# Patient Record
Sex: Male | Born: 1975 | Race: Black or African American | Hispanic: No | Marital: Single | Smoking: Current every day smoker
Health system: Southern US, Community
[De-identification: ages and names within clinical notes are randomized; demographics above are authoritative.]

## PROBLEM LIST (undated history)

## (undated) DIAGNOSIS — R519 Headache, unspecified: Secondary | ICD-10-CM

## (undated) DIAGNOSIS — K642 Third degree hemorrhoids: Secondary | ICD-10-CM

## (undated) DIAGNOSIS — R079 Chest pain, unspecified: Secondary | ICD-10-CM

## (undated) DIAGNOSIS — Z9289 Personal history of other medical treatment: Secondary | ICD-10-CM

## (undated) DIAGNOSIS — Z86010 Personal history of colonic polyps: Secondary | ICD-10-CM

## (undated) DIAGNOSIS — D509 Iron deficiency anemia, unspecified: Secondary | ICD-10-CM

## (undated) DIAGNOSIS — R51 Headache: Secondary | ICD-10-CM

## (undated) DIAGNOSIS — Z8719 Personal history of other diseases of the digestive system: Secondary | ICD-10-CM

## (undated) DIAGNOSIS — R011 Cardiac murmur, unspecified: Secondary | ICD-10-CM

## (undated) DIAGNOSIS — K644 Residual hemorrhoidal skin tags: Secondary | ICD-10-CM

## (undated) HISTORY — PX: MOUTH SURGERY: SHX715

---

## 2007-07-26 ENCOUNTER — Emergency Department (HOSPITAL_COMMUNITY): Admission: EM | Admit: 2007-07-26 | Discharge: 2007-07-26 | Payer: Self-pay | Admitting: Emergency Medicine

## 2016-12-22 DIAGNOSIS — R079 Chest pain, unspecified: Secondary | ICD-10-CM

## 2016-12-22 DIAGNOSIS — Z9289 Personal history of other medical treatment: Secondary | ICD-10-CM

## 2016-12-22 HISTORY — DX: Chest pain, unspecified: R07.9

## 2016-12-22 HISTORY — DX: Personal history of other medical treatment: Z92.89

## 2017-01-13 ENCOUNTER — Inpatient Hospital Stay (HOSPITAL_BASED_OUTPATIENT_CLINIC_OR_DEPARTMENT_OTHER)
Admission: EM | Admit: 2017-01-13 | Discharge: 2017-01-17 | DRG: 312 | Disposition: A | Discharge status: Home / Self Care | Attending: Internal Medicine | Admitting: Internal Medicine

## 2017-01-13 ENCOUNTER — Emergency Department (HOSPITAL_BASED_OUTPATIENT_CLINIC_OR_DEPARTMENT_OTHER)

## 2017-01-13 ENCOUNTER — Encounter (HOSPITAL_BASED_OUTPATIENT_CLINIC_OR_DEPARTMENT_OTHER): Payer: Self-pay | Admitting: Emergency Medicine

## 2017-01-13 ENCOUNTER — Other Ambulatory Visit: Payer: Self-pay

## 2017-01-13 DIAGNOSIS — D500 Iron deficiency anemia secondary to blood loss (chronic): Secondary | ICD-10-CM | POA: Diagnosis present

## 2017-01-13 DIAGNOSIS — K642 Third degree hemorrhoids: Secondary | ICD-10-CM | POA: Diagnosis present

## 2017-01-13 DIAGNOSIS — E860 Dehydration: Secondary | ICD-10-CM | POA: Diagnosis present

## 2017-01-13 DIAGNOSIS — Z86010 Personal history of colonic polyps: Secondary | ICD-10-CM

## 2017-01-13 DIAGNOSIS — I951 Orthostatic hypotension: Principal | ICD-10-CM | POA: Diagnosis present

## 2017-01-13 DIAGNOSIS — D649 Anemia, unspecified: Secondary | ICD-10-CM | POA: Diagnosis not present

## 2017-01-13 DIAGNOSIS — K644 Residual hemorrhoidal skin tags: Secondary | ICD-10-CM | POA: Diagnosis present

## 2017-01-13 DIAGNOSIS — I509 Heart failure, unspecified: Secondary | ICD-10-CM | POA: Diagnosis not present

## 2017-01-13 DIAGNOSIS — K921 Melena: Secondary | ICD-10-CM | POA: Diagnosis present

## 2017-01-13 DIAGNOSIS — F17210 Nicotine dependence, cigarettes, uncomplicated: Secondary | ICD-10-CM | POA: Diagnosis present

## 2017-01-13 DIAGNOSIS — K640 First degree hemorrhoids: Secondary | ICD-10-CM | POA: Diagnosis not present

## 2017-01-13 DIAGNOSIS — K625 Hemorrhage of anus and rectum: Secondary | ICD-10-CM | POA: Diagnosis not present

## 2017-01-13 LAB — PROTIME-INR
INR: 1.03
Prothrombin Time: 13.4 seconds (ref 11.4–15.2)

## 2017-01-13 LAB — URINALYSIS, ROUTINE W REFLEX MICROSCOPIC
BILIRUBIN URINE: NEGATIVE
Glucose, UA: NEGATIVE mg/dL
Hgb urine dipstick: NEGATIVE
Ketones, ur: NEGATIVE mg/dL
Leukocytes, UA: NEGATIVE
NITRITE: NEGATIVE
PROTEIN: NEGATIVE mg/dL
SPECIFIC GRAVITY, URINE: 1.015 (ref 1.005–1.030)
pH: 6.5 (ref 5.0–8.0)

## 2017-01-13 LAB — COMPREHENSIVE METABOLIC PANEL
ALK PHOS: 79 U/L (ref 38–126)
ALT: 11 U/L — AB (ref 17–63)
AST: 16 U/L (ref 15–41)
Albumin: 3.7 g/dL (ref 3.5–5.0)
Anion gap: 6 (ref 5–15)
BILIRUBIN TOTAL: 0.2 mg/dL — AB (ref 0.3–1.2)
BUN: 11 mg/dL (ref 6–20)
CALCIUM: 8.6 mg/dL — AB (ref 8.9–10.3)
CO2: 25 mmol/L (ref 22–32)
CREATININE: 0.86 mg/dL (ref 0.61–1.24)
Chloride: 104 mmol/L (ref 101–111)
GFR calc non Af Amer: >60 mL/min (ref >60)
GLUCOSE: 108 mg/dL — AB (ref 65–99)
Potassium: 3.5 mmol/L (ref 3.5–5.1)
SODIUM: 135 mmol/L (ref 135–145)
TOTAL PROTEIN: 6.9 g/dL (ref 6.5–8.1)

## 2017-01-13 LAB — LIPASE, BLOOD: LIPASE: 26 U/L (ref 11–51)

## 2017-01-13 LAB — RETICULOCYTES
RBC.: 2.17 MIL/uL — AB (ref 4.22–5.81)
RETIC COUNT ABSOLUTE: 30.4 10*3/uL (ref 19.0–186.0)
RETIC CT PCT: 1.4 % (ref 0.4–3.1)

## 2017-01-13 LAB — CBC WITH DIFFERENTIAL/PLATELET
BASOS PCT: 0 %
Basophils Absolute: 0 10*3/uL (ref 0.0–0.1)
EOS ABS: 0.2 10*3/uL (ref 0.0–0.7)
Eosinophils Relative: 3 %
HCT: 12.2 % — ABNORMAL LOW (ref 39.0–52.0)
Hemoglobin: 3.2 g/dL — CL (ref 13.0–17.0)
Lymphocytes Relative: 34 %
Lymphs Abs: 1.8 10*3/uL (ref 0.7–4.0)
MCH: 14.2 pg — AB (ref 26.0–34.0)
MCHC: 26.2 g/dL — AB (ref 30.0–36.0)
MCV: 54.2 fL — ABNORMAL LOW (ref 78.0–100.0)
MONO ABS: 0.5 10*3/uL (ref 0.1–1.0)
Monocytes Relative: 10 %
NEUTROS ABS: 2.9 10*3/uL (ref 1.7–7.7)
NEUTROS PCT: 53 %
PLATELETS: 334 10*3/uL (ref 150–400)
RBC: 2.25 MIL/uL — ABNORMAL LOW (ref 4.22–5.81)
RDW: 22.2 % — ABNORMAL HIGH (ref 11.5–15.5)
WBC: 5.4 10*3/uL (ref 4.0–10.5)

## 2017-01-13 LAB — FERRITIN: Ferritin: 1 ng/mL — ABNORMAL LOW (ref 24–336)

## 2017-01-13 LAB — IRON AND TIBC
Iron: 7 ug/dL — ABNORMAL LOW (ref 45–182)
Saturation Ratios: 1 % — ABNORMAL LOW (ref 17.9–39.5)
TIBC: 595 ug/dL — AB (ref 250–450)
UIBC: 588 ug/dL

## 2017-01-13 LAB — PREPARE RBC (CROSSMATCH)

## 2017-01-13 LAB — OCCULT BLOOD X 1 CARD TO LAB, STOOL: FECAL OCCULT BLD: NEGATIVE

## 2017-01-13 LAB — FOLATE: Folate: 8.7 ng/mL (ref >5.9)

## 2017-01-13 LAB — TROPONIN I

## 2017-01-13 LAB — VITAMIN B12: VITAMIN B 12: 276 pg/mL (ref 180–914)

## 2017-01-13 MED ORDER — ACETAMINOPHEN 325 MG PO TABS
650.0000 mg | ORAL_TABLET | Freq: Once | ORAL | Status: AC
  Administered 2017-01-13: 650 mg via ORAL
  Filled 2017-01-13: qty 2

## 2017-01-13 MED ORDER — SODIUM CHLORIDE 0.9 % IV BOLUS (SEPSIS)
1000.0000 mL | Freq: Once | INTRAVENOUS | Status: AC
  Administered 2017-01-13: 1000 mL via INTRAVENOUS

## 2017-01-13 MED ORDER — SODIUM CHLORIDE 0.9 % IV SOLN
10.0000 mL/h | Freq: Once | INTRAVENOUS | Status: AC
  Administered 2017-01-13: 10 mL/h via INTRAVENOUS

## 2017-01-13 NOTE — ED Triage Notes (Signed)
Pt sent from UC c/o headache, dizziness for 1 week.

## 2017-01-13 NOTE — ED Notes (Signed)
Pt in radiology 

## 2017-01-13 NOTE — Progress Notes (Signed)
Spoke to [facility]uen [facility]  41 year old male was seen in the ED for dizziness, found to have profoundly low hemoglobin 3.2.  He had 2 episodes of bright red blood per rectum few weeks ago as per ED physician.  I have asked ED physician to check guaiac stool and anemia panel.  Patient will need 3 -4 PRBC transfusion once [facility]. Stool for occult blood Workup for anemia.

## 2017-01-13 NOTE — ED Notes (Signed)
Pt given urinal to provide urine sample

## 2017-01-13 NOTE — ED Notes (Signed)
Critical lab HGB 3.2 Dr Corlis LeakMackuen notified.

## 2017-01-13 NOTE — ED Provider Notes (Signed)
[facility] EMERGENCY DEPARTMENT Provider Note   CSN: [id number] Arrival date & time: 01/13/17  1715     History   Chief Complaint Chief Complaint  Patient presents with  . Dizziness  . Headache    HPI Charles Franco is a 41 y.o. male.  HPI  Patient is a 41 year old male presenting with dizziness.  Patient reports that when he stands up he gets mildly dizzy.  He reports that going on for the last week.  He reports going to an urgent care, according to them he had a "holosystolic murmur" with a "wide pulse pressure".  They were sent here to be "seen by cardiologist".  We do not have access to cardiologist here.  I also hear no systolic murmur on exam.  His dizziness is described as lightheadedness.  It only happens when he goes from lying down to standing.  He appears mildly dehydrated on exam..  Patient has a low-grade fever.  I am concerned most about a low-grade virus causing him to be dehydrated and causing orthostatic hypotension.  He is got no other symptoms such as chest pain, nausea, vomiting, cough, abdominal pain, neurologic symptoms.  He has no past medical history either.  And no family cardiac history.  History reviewed. No pertinent past medical history.  There are no active problems to display for this patient.   History reviewed. No pertinent surgical history.     Home Medications    Prior to Admission medications   Not on File    Family History No family history on file.  Social History Social History   Tobacco Use  . Smoking status: Current Every Day Smoker  . Smokeless tobacco: Never Used  Substance Use Topics  . Alcohol use: Yes  . Drug use: No     Allergies   Patient has no known allergies.   Review of Systems Review of Systems  Constitutional: Positive for fatigue. Negative for activity change and fever.  HENT: Negative for congestion.   Eyes: Negative for photophobia.  Respiratory: Negative for cough, chest  tightness and shortness of breath.   Cardiovascular: Negative for chest pain and palpitations.  Gastrointestinal: Negative for abdominal pain.  Genitourinary: Negative for dysuria.  Neurological: Positive for light-headedness and headaches. Negative for dizziness, tremors, seizures, syncope, speech difficulty, weakness and numbness.  All other systems reviewed and are negative.    Physical Exam Updated Vital Signs BP (!) 129/53 (BP Location: Right Arm)   Pulse 98   Temp (!) 100.4 F (38 C) (Oral)   Resp 16   Ht 6\' 1"  (1.854 m)   Wt 74.8 kg (165 lb)   SpO2 99%   BMI 21.77 kg/m   Physical Exam  Constitutional: He is oriented to person, place, and time. He appears well-nourished.  HENT:  Head: Normocephalic and atraumatic.  Eyes: Conjunctivae and EOM are normal. Pupils are equal, round, and reactive to light. Right eye exhibits normal extraocular motion. Left eye exhibits normal extraocular motion.  Neck: No tracheal deviation present.  Cardiovascular: Normal rate and normal heart sounds.  Pulmonary/Chest: Effort normal and breath sounds normal.  Abdominal: Soft. Bowel sounds are normal. He exhibits no distension.  Neurological: He is oriented to person, place, and time. He has normal strength. Coordination and gait normal.  Equal strength bilaterally upper and lower extremities negative pronator drift. Normal sensation bilaterally. Speech comprehensible, no slurring. Facial nerve tested and appears grossly normal. Alert and oriented 3.   Skin: Skin is warm and  dry. He is not diaphoretic.  Psychiatric: He has a normal mood and affect. His behavior is normal.     ED Treatments / Results  Labs (all labs ordered are listed, but only abnormal results are displayed) Labs Reviewed  COMPREHENSIVE METABOLIC PANEL  CBC WITH DIFFERENTIAL/PLATELET  LIPASE, BLOOD  RPR  HIV ANTIBODY (ROUTINE TESTING)  URINALYSIS, ROUTINE W REFLEX MICROSCOPIC  I-STAT TROPONIN, ED    EKG  EKG  Interpretation None       Radiology No results found.  Procedures Procedures (including critical care time)  CRITICAL CARE Performed by: Arlana Hoveourteney L Oma Marzan Total critical care time: 60 minutes Critical care time was exclusive of separately billable procedures and treating other patients. Critical care was necessary to treat or prevent imminent or life-threatening deterioration. Critical care was time spent personally by me on the following activities: development of treatment plan with patient and/or surrogate as well as nursing, discussions with consultants, evaluation of patient's response to treatment, examination of patient, obtaining history from patient or surrogate, ordering and performing treatments and interventions, ordering and review of laboratory studies, ordering and review of radiographic studies, pulse oximetry and re-evaluation of patient's condition.   Medications Ordered in ED Medications  sodium chloride 0.9 % bolus 1,000 mL (not administered)  acetaminophen (TYLENOL) tablet 650 mg (650 mg Oral Given 01/13/17 1729)     Initial Impression / Assessment and Plan / ED Course  I have reviewed the triage vital signs and the nursing notes.  Pertinent labs & imaging results that were available during my care of the patient were reviewed by me and considered in my medical decision making (see chart for details).     Patient is a 41 year old male presenting with dizziness.  Patient reports that when he stands up he gets mildly dizzy.  He reports that going on for the last week.  He reports going to an urgent care, according to them he had a "holosystolic murmur" with a "wide pulse pressure".  They were sent here to be "seen by cardiologist".  We do not have access to cardiologist here.  I also hear no systolic murmur on exam.  His dizziness is described as lightheadedness.  It only happens when he goes from lying down to standing.  He appears mildly dehydrated on exam..   Patient has a low-grade fever.  I am concerned most about a low-grade virus causing him to be dehydrated and causing orthostatic hypotension.  He is got no other symptoms such as chest pain, nausea, vomiting, cough, abdominal pain, neurologic symptoms.  He has no past medical history either.  And no family cardiac history.  6:50 PM We will repeat EKG, get some baseline labs.  Will get CT head given the dizziness and give some fluids because I am considering that he may be dehydrated vs electrolyte distrubrabce.  7:44 PM Patient's hemoglobin is surprisingly low.  Unsure of the source.  Will admit to hospital given his low hemoglobin, will need transfusions and further workup.  8:28 PM Discussed with DR. Lama.  Occult cared to lab.  Patient does report one large episode of blood per rectum 2 weeks ago.  [place] [employer], the most recent time was in 2012.  Will send anemia panel, and then transfuse.  Final Clinical Impressions(s) / ED Diagnoses   Final diagnoses:  None    ED Discharge Orders    None       Abelino DerrickMackuen, Bert Givans Lyn, MD 01/13/17 2029

## 2017-01-13 NOTE — ED Notes (Signed)
Pt in CT.

## 2017-01-14 DIAGNOSIS — Z86010 Personal history of colonic polyps: Secondary | ICD-10-CM

## 2017-01-14 DIAGNOSIS — D500 Iron deficiency anemia secondary to blood loss (chronic): Secondary | ICD-10-CM

## 2017-01-14 DIAGNOSIS — K625 Hemorrhage of anus and rectum: Secondary | ICD-10-CM

## 2017-01-14 DIAGNOSIS — D649 Anemia, unspecified: Secondary | ICD-10-CM

## 2017-01-14 LAB — COMPREHENSIVE METABOLIC PANEL
ALBUMIN: 3.7 g/dL (ref 3.5–5.0)
ALT: 14 U/L — ABNORMAL LOW (ref 17–63)
ANION GAP: 6 (ref 5–15)
AST: 17 U/L (ref 15–41)
Alkaline Phosphatase: 89 U/L (ref 38–126)
BUN: 10 mg/dL (ref 6–20)
CALCIUM: 8.8 mg/dL — AB (ref 8.9–10.3)
CHLORIDE: 105 mmol/L (ref 101–111)
CO2: 26 mmol/L (ref 22–32)
Creatinine, Ser: 0.85 mg/dL (ref 0.61–1.24)
GFR calc non Af Amer: >60 mL/min (ref >60)
Glucose, Bld: 89 mg/dL (ref 65–99)
POTASSIUM: 3.9 mmol/L (ref 3.5–5.1)
SODIUM: 137 mmol/L (ref 135–145)
Total Bilirubin: 2.3 mg/dL — ABNORMAL HIGH (ref 0.3–1.2)
Total Protein: 7 g/dL (ref 6.5–8.1)

## 2017-01-14 LAB — CBC
HCT: 21.8 % — ABNORMAL LOW (ref 39.0–52.0)
Hemoglobin: 6.6 g/dL — CL (ref 13.0–17.0)
MCH: 19.6 pg — AB (ref 26.0–34.0)
MCHC: 30.3 g/dL (ref 30.0–36.0)
MCV: 64.9 fL — ABNORMAL LOW (ref 78.0–100.0)
PLATELETS: 266 10*3/uL (ref 150–400)
RBC: 3.36 MIL/uL — ABNORMAL LOW (ref 4.22–5.81)
RDW: 28.5 % — AB (ref 11.5–15.5)
WBC: 6.1 10*3/uL (ref 4.0–10.5)

## 2017-01-14 LAB — PREPARE RBC (CROSSMATCH)

## 2017-01-14 LAB — ABO/RH: ABO/RH(D): A POS

## 2017-01-14 MED ORDER — ONDANSETRON HCL 4 MG PO TABS: 4.0000 mg | ORAL_TABLET | Freq: Four times a day (QID) | ORAL | Status: DC | PRN

## 2017-01-14 MED ORDER — ACETAMINOPHEN 650 MG RE SUPP: 650.0000 mg | Freq: Four times a day (QID) | RECTAL | Status: DC | PRN

## 2017-01-14 MED ORDER — ORAL CARE MOUTH RINSE
15.0000 mL | Freq: Two times a day (BID) | OROMUCOSAL | Status: DC
  Administered 2017-01-14 – 2017-01-16 (×4): 15 mL via OROMUCOSAL

## 2017-01-14 MED ORDER — SODIUM CHLORIDE 0.9 % IV SOLN: Freq: Once | INTRAVENOUS | Status: DC

## 2017-01-14 MED ORDER — ACETAMINOPHEN 325 MG PO TABS: 650.0000 mg | ORAL_TABLET | Freq: Four times a day (QID) | ORAL | Status: DC | PRN

## 2017-01-14 MED ORDER — ONDANSETRON HCL 4 MG/2ML IJ SOLN: 4.0000 mg | Freq: Four times a day (QID) | INTRAMUSCULAR | Status: DC | PRN

## 2017-01-14 MED ORDER — SODIUM CHLORIDE 0.9% FLUSH
3.0000 mL | Freq: Two times a day (BID) | INTRAVENOUS | Status: DC
  Administered 2017-01-14 – 2017-01-15 (×4): 3 mL via INTRAVENOUS

## 2017-01-14 MED ORDER — SODIUM CHLORIDE 0.9 % IV SOLN: 250.0000 mL | INTRAVENOUS | Status: DC | PRN

## 2017-01-14 MED ORDER — SODIUM CHLORIDE 0.9% FLUSH: 3.0000 mL | INTRAVENOUS | Status: DC | PRN

## 2017-01-14 MED ORDER — SODIUM CHLORIDE 0.9 % IV SOLN
Freq: Once | INTRAVENOUS | Status: AC
  Administered 2017-01-14: 04:00:00 via INTRAVENOUS

## 2017-01-14 NOTE — Progress Notes (Signed)
PROGRESS NOTE  Kandace ParkinsJoseph L Westby WUJ:[id number] DOB: 09/23/1975 DOA: 01/13/2017 PCP: Patient, No Pcp Per  HPI/Recap of past 24 hours:  Feeling better, denies pain, no sob, no fever, no active bleed  Assessment/Plan: Active Problems:   Symptomatic anemia  Blood loss anemia, symptomatic anemia hgb 3.2 on admission, plt and inr unremarkable prbc transfusion, keep hgb >7,  Likely gi loss per history  Gi consulted  H/o alcohol use, tobacco use   Code Status: full  Family Communication: patient   Disposition Plan: not ready to discharge   Consultants:  GI  Procedures:  egd/colonoscopy planned on monday  Antibiotics:  none   Objective: BP (!) 120/55   Pulse 78   Temp 99.6 F (37.6 C) (Oral)   Resp 16   Ht 6\' 1"  (1.854 m)   Wt 74.8 kg (165 lb)   SpO2 100%   BMI 21.77 kg/m   Intake/Output Summary (Last 24 hours) at 01/14/2017 1920 Last data filed at 01/14/2017 1418 Gross per 24 hour  Intake 2801.92 ml  Output -  Net 2801.92 ml   Filed Weights   01/13/17 1725  Weight: 74.8 kg (165 lb)    Exam: Patient is examined daily including today on 01/14/2017, exams remain the same as of yesterday except that has changed    General:  NAD  Cardiovascular: RRR  Respiratory: CTABL  Abdomen: Soft/ND/NT, positive BS  Musculoskeletal: No Edema  Neuro: alert, oriented   Data Reviewed: Basic Metabolic Panel: Recent Labs  Lab 01/13/17 1820 01/14/17 1553  NA 135 137  K 3.5 3.9  CL 104 105  CO2 25 26  GLUCOSE 108* 89  BUN 11 10  CREATININE 0.86 0.85  CALCIUM 8.6* 8.8*   Liver Function Tests: Recent Labs  Lab 01/13/17 1820 01/14/17 1553  AST 16 17  ALT 11* 14*  ALKPHOS 79 89  BILITOT 0.2* 2.3*  PROT 6.9 7.0  ALBUMIN 3.7 3.7   Recent Labs  Lab 01/13/17 1820  LIPASE 26   No results for input(s): AMMONIA in the last 168 hours. CBC: Recent Labs  Lab 01/13/17 1820 01/14/17 1553  WBC 5.4 6.1  NEUTROABS 2.9  --   HGB 3.2* 6.6*  HCT  12.2* 21.8*  MCV 54.2* 64.9*  PLT 334 266   Cardiac Enzymes:   Recent Labs  Lab 01/13/17 1820  TROPONINI <0.03   BNP (last 3 results) No results for input(s): BNP in the last 8760 hours.  ProBNP (last 3 results) No results for input(s): PROBNP in the last 8760 hours.  CBG: No results for input(s): GLUCAP in the last 168 hours.  No results found for this or any previous visit (from the past 240 hour(s)).   Studies: No results found.  Scheduled Meds: . mouth rinse  15 mL Mouth Rinse BID  . sodium chloride flush  3 mL Intravenous Q12H    Continuous Infusions: . sodium chloride    . sodium chloride        I have personally reviewed and interpreted on  01/14/2017 daily labs, tele strips, imagings as discussed above under date review session and assessment and plans.  I reviewed all nursing notes,  consultant notes,  vitals, pertinent old records  I have discussed plan of care as described above with RN , patient on 01/14/2017   Albertine GratesFang Numa Schroeter MD, PhD  [facility] Pager [phone]. If 7PM-7AM, please contact night-coverage at [email or web address], password [email or web address] 01/14/2017, 7:20 PM  LOS: 1 day

## 2017-01-14 NOTE — H&P (Signed)
TRH H&P    Patient Demographics:    Charles Franco, is a 41 y.o. male  MRN: [id number]  DOB - 11/07/1975  Admit Date - 01/13/2017    Outpatient Primary MD for the patient is Patient, No Pcp Per  [facility]  Chief Complaint  Patient presents with  . Dizziness  . Headache      HPI:    Charles MagesJoseph Carrero  is a 41 y.o. male,.. With no significant medical problems presented [facility] with chief complaint of dizziness.  Patient says that this is been going on for past 1 week.  Patient went to urgent care and was told that he has holosystolic murmur with wide pulse pressure and he was sent to ED to be seen by cardiologist.  Patient complains of dizziness and lightheadedness only happens when he gets from lying down to standing position.  He denies passing out.  Mild shortness of breath.  No chest pain.  He complains of one episode of bright red blood per rectum a week ago.  In the ED, lab work showed hemoglobin of 3.2, MCV 54.2. 1 unit PRBC was given in the ED.  He denies nausea vomiting or diarrhea.   Denies black colored stools Denies vomiting any blood Denies hematuria He is sexually active with male, one partner, always uses condom. He denies taking over-the-counter NSAIDs    Review of systems:     All other systems reviewed and are negative.   With Past History of the following :      Social History:      Social History   Tobacco Use  . Smoking status: Current Every Day Smoker  . Smokeless tobacco: Never Used  Substance Use Topics  . Alcohol use: Yes       Family History :   No family history of cancer or heart disease   Home Medications:   Prior to Admission medications   Not on File     Allergies:    No Known Allergies   Physical Exam:   Vitals  Blood pressure (!) 125/58, pulse 74, temperature 98.6 F (37 C), resp.  rate 15, height 6\' 1"  (1.854 m), weight 74.8 kg (165 lb), SpO2 100 %.  1.  General: Appears in no acute distress  2. Psychiatric:  Intact judgement and  insight, awake alert, oriented x 3.  3. Neurologic: No focal neurological deficits, all cranial nerves intact.Strength 5/5 all 4 extremities, sensation intact all 4 extremities, plantars down going.  4. Eyes :  anicteric sclerae, moist conjunctivae with no lid lag. PERRLA.  5. ENMT:  Oropharynx clear with moist mucous membranes and good dentition  6. Neck:  supple, no cervical lymphadenopathy appriciated, No thyromegaly  7. Respiratory : Normal respiratory effort, good air movement bilaterally,clear to  auscultation bilaterally  8. Cardiovascular : RRR, no gallops, rubs or murmurs, no leg edema  9. Gastrointestinal:  Positive bowel sounds, abdomen soft, non-tender to palpation,no hepatosplenomegaly, no rigidity or guarding  10. Skin:  No cyanosis, normal texture and turgor, no rash, lesions or ulcers  11.Musculoskeletal:  Good muscle tone,  joints appear normal , no effusions,  normal range of motion    Data Review:    CBC Recent Labs  Lab 01/13/17 1820  WBC 5.4  HGB 3.2*  HCT 12.2*  PLT 334  MCV 54.2*  MCH 14.2*  MCHC 26.2*  RDW 22.2*  LYMPHSABS 1.8  MONOABS 0.5  EOSABS 0.2  BASOSABS 0.0   ------------------------------------------------------------------------------------------------------------------  Chemistries  Recent Labs  Lab 01/13/17 1820  NA 135  K 3.5  CL 104  CO2 25  GLUCOSE 108*  BUN 11  CREATININE 0.86  CALCIUM 8.6*  AST 16  ALT 11*  ALKPHOS 79  BILITOT 0.2*   ------------------------------------------------------------------------------------------------------------------  ------------------------------------------------------------------------------------------------------------------ GFR: Estimated Creatinine Clearance: 120.8 mL/min (by C-G formula based on SCr of  0.86 mg/dL). Liver Function Tests: Recent Labs  Lab 01/13/17 1820  AST 16  ALT 11*  ALKPHOS 79  BILITOT 0.2*  PROT 6.9  ALBUMIN 3.7   Recent Labs  Lab 01/13/17 1820  LIPASE 26   No results for input(s): AMMONIA in the last 168 hours. Coagulation Profile: Recent Labs  Lab 01/13/17 2050  INR 1.03   Cardiac Enzymes: Recent Labs  Lab 01/13/17 1820  TROPONINI <0.03    Recent Labs    01/13/17 2050  VITAMINB12 276  FOLATE 8.7  FERRITIN 1*  TIBC 595*  IRON 7*  RETICCTPCT 1.4    --------------------------------------------------------------------------------------------------------------- Urine analysis:    Component Value Date/Time   COLORURINE YELLOW 01/13/2017 1912   APPEARANCEUR CLEAR 01/13/2017 1912   LABSPEC 1.015 01/13/2017 1912   PHURINE 6.5 01/13/2017 1912   GLUCOSEU NEGATIVE 01/13/2017 1912   HGBUR NEGATIVE 01/13/2017 1912   BILIRUBINUR NEGATIVE 01/13/2017 1912   KETONESUR NEGATIVE 01/13/2017 1912   PROTEINUR NEGATIVE 01/13/2017 1912   NITRITE NEGATIVE 01/13/2017 1912   LEUKOCYTESUR NEGATIVE 01/13/2017 1912      Imaging Results:    Dg Chest 2 View  Result Date: 01/13/2017 CLINICAL DATA:  Chest pain, worse today. EXAM: CHEST  2 VIEW COMPARISON:  None. FINDINGS: Mild cardiomegaly for age. Normal mediastinal contours. No pulmonary edema. No consolidation, pleural fluid or pneumothorax. No acute osseous abnormality. IMPRESSION: Mild cardiomegaly. No congestive failure or acute pulmonary process. Electronically Signed   By: Rubye Oaks M.D.   On: 01/13/2017 19:12   Ct Head Wo Contrast  Result Date: 01/13/2017 CLINICAL DATA:  Headache and dizziness x1 week EXAM: CT HEAD WITHOUT CONTRAST TECHNIQUE: Contiguous axial images were obtained from the base of the skull through the vertex without intravenous contrast. COMPARISON:  None. FINDINGS: Brain: No evidence of acute infarction, hemorrhage, hydrocephalus, extra-axial collection or mass lesion/mass  effect. Vascular: No hyperdense vessel or unexpected calcification. Skull: Normal. Negative for fracture or focal lesion. Sinuses/Orbits: No acute finding. Other: None IMPRESSION: No acute intracranial abnormality. Electronically Signed   By: Tollie Eth M.D.   On: 01/13/2017 19:14    My personal review of EKG: Rhythm NSR   Assessment & Plan:    Active Problems:   Symptomatic anemia   1. Severe iron deficiency anemia-anemia panel shows iron level 7, 1% saturation, TIBC 595.  Unclear etiology, likely GI source as patient had one episode of bright red blood per rectum a week ago.  I have consulted gastroenterology, spoke to Dr. Rosalia Hammers, who will see patient in a.m.  patient already received 1 unit PRBC at [facility].  Will transfuse  2 more units, follow CBC in a.m.    DVT Prophylaxis-   SCDs  AM Labs Ordered, also please review Full Orders  Family Communication: Admission, patients condition and plan of care including tests being ordered have been discussed with the patient  who indicate understanding and agree with the plan and Code Status.  Code Status:  Full code  Admission status: Inpatient  Time spent in minutes : 60 minutes   Meredeth IdeGagan S Krisha Beegle M.D on 01/14/2017 at 12:23 AM  Between 7am to 7pm - Pager - [phone]. After 7pm go to [email or web address] - password [email or web address]  [facility] - Office  [phone]

## 2017-01-14 NOTE — Consult Note (Signed)
Consultation  Referring Provider: Dr. Roda ShuttersXu     Primary Care Physician:  Patient, No Pcp Per Primary Gastroenterologist:  unassigned    ([facility]   Reason for Consultation: IDA            HPI:   Kandace ParkinsJoseph L Beckley is a 41 y.o. AA male with no pertinent past medical history, who presented to the ER on 01/13/17 with a complaint of increasing dizziness and headache over the past week. Patient is a poor historian today.      According to ED provider patient reported going to an urgent care, according to them he had a "holosystolic murmur" with a "wide pulse pressure".  He was sent to the ER to see a cardiologist per his recollection.    Today, the patient reports that for the past 2 weeks he has had increasing dizziness and headache.  The patient does tell me that about 3 weeks ago for 3 days in a row he had 3-4 bowel movements with "a lot of bright red blood".  This then stopped on its own and patient "did not think much about it".  Patient describes normal daily bowel movement since that time with his last one being yesterday morning.  These have been solid.  Other than weakness and generalized fatigue patient denies other complaints today.    Per further discussion patient describes a colonoscopy in [place] the [facility] in [place].  Apparently he had "polyps and was put on antibiotics".  Further into our discussion patient tells me he was told he may have been anemic around that time and also had an EGD.  He has not been followed by them since.    Patient does report history of some Goody powder use, though this is only occasional for aches and pains.    Patient denies fever, chills, heartburn, reflux, nausea, vomiting, abdominal pain, rectal pain, syncope or near syncope.  History reviewed. No pertinent past medical history.  History reviewed. No pertinent surgical history.  Family History: Patient denies history of colorectal cancer, stomach cancer or colon polyps.  Social History    Tobacco Use  . Smoking status: Current Every Day Smoker  . Smokeless tobacco: Never Used  Substance Use Topics  . Alcohol use: Yes  . Drug use: No    Prior to Admission medications   Medication Sig Start Date End Date Taking? Authorizing Provider  acetaminophen (TYLENOL) 500 MG tablet Take 1,000 mg by mouth every 6 (six) hours as needed for moderate pain, fever or headache.   Yes [provider]    Current Facility-Administered Medications  Medication Dose Route Frequency Provider Last Rate Last Dose  . 0.9 %  sodium chloride infusion  250 mL Intravenous PRN Meredeth IdeLama, Gagan S, MD      . acetaminophen (TYLENOL) tablet 650 mg  650 mg Oral Q6H PRN Meredeth IdeLama, Gagan S, MD       Or  . acetaminophen (TYLENOL) suppository 650 mg  650 mg Rectal Q6H PRN Meredeth IdeLama, Gagan S, MD      . MEDLINE mouth rinse  15 mL Mouth Rinse BID Albertine GratesXu, Fang, MD      . ondansetron [facility](ZOFRAN) tablet 4 mg  4 mg Oral Q6H PRN Meredeth IdeLama, Gagan S, MD       Or  . ondansetron (ZOFRAN) injection 4 mg  4 mg Intravenous Q6H PRN Meredeth IdeLama, Gagan S, MD      . sodium chloride flush (NS) 0.9 % injection 3 mL  3 mL Intravenous Q12H Meredeth Ide, MD   3 mL at 01/14/17 0030  . sodium chloride flush (NS) 0.9 % injection 3 mL  3 mL Intravenous PRN Meredeth Ide, MD        Allergies as of 01/13/2017  . (No Known Allergies)     Review of Systems:    Constitutional: No weight loss, fever or chills Skin: No rash Cardiovascular: No chest pain Respiratory: No SOB  Gastrointestinal: See HPI and otherwise negative Genitourinary: No dysuria  Neurological: No headache Musculoskeletal: No new muscle or joint pain Hematologic: No bleeding Psychiatric: No history of depression or anxiety   Physical Exam:  Vital signs in last 24 hours: Temp:  [97.9 F (36.6 C)-100.4 F (38 C)] 98.9 F (37.2 C) (11/24 0648) Pulse Rate:  [64-98] 71 (11/24 0648) Resp:  [12-18] 16 (11/24 0627) BP: (113-129)/(46-69) 116/58 (11/24 0648) SpO2:  [99 %-100 %] 100 %  (11/24 0648) Weight:  [165 lb (74.8 kg)] 165 lb (74.8 kg) (11/23 1725) Last BM Date: 01/13/17 General:   Pleasant AA male appears to be in NAD, Well developed, Well nourished, alert and cooperative Head:  Normocephalic and atraumatic. Eyes:   PEERL, EOMI. No icterus. Conjunctiva pink. Ears:  Normal auditory acuity. Neck:  Supple Throat: Oral cavity and pharynx without inflammation, swelling or lesion. Lungs: Respirations even and unlabored. Lungs clear to auscultation bilaterally.   No wheezes, crackles, or rhonchi.  Heart: Normal S1, S2. No MRG. Regular rate and rhythm. No peripheral edema, cyanosis or pallor.  Abdomen:  Soft, nondistended, nontender. No rebound or guarding. Normal bowel sounds. No appreciable masses or hepatomegaly. Rectal:  Not performed.  Msk:  Symmetrical without gross deformities. Peripheral pulses intact.  Extremities:  Without edema, no deformity or joint abnormality.  Neurologic:  Alert and  oriented x4;  grossly normal neurologically.  Skin:   Dry and intact without significant lesions or rashes. Psychiatric: Demonstrates good judgement and reason without abnormal affect or behaviors.   LAB RESULTS: Recent Labs    01/13/17 1820  WBC 5.4  HGB 3.2*  HCT 12.2*  PLT 334   BMET Recent Labs    01/13/17 1820  NA 135  K 3.5  CL 104  CO2 25  GLUCOSE 108*  BUN 11  CREATININE 0.86  CALCIUM 8.6*   LFT Recent Labs    01/13/17 1820  PROT 6.9  ALBUMIN 3.7  AST 16  ALT 11*  ALKPHOS 79  BILITOT 0.2*   PT/INR Recent Labs    01/13/17 2050  LABPROT 13.4  INR 1.03    STUDIES: Dg Chest 2 View  Result Date: 01/13/2017 CLINICAL DATA:  Chest pain, worse today. EXAM: CHEST  2 VIEW COMPARISON:  None. FINDINGS: Mild cardiomegaly for age. Normal mediastinal contours. No pulmonary edema. No consolidation, pleural fluid or pneumothorax. No acute osseous abnormality. IMPRESSION: Mild cardiomegaly. No congestive failure or acute pulmonary process.  Electronically Signed   By: Rubye Oaks M.D.   On: 01/13/2017 19:12   Ct Head Wo Contrast  Result Date: 01/13/2017 CLINICAL DATA:  Headache and dizziness x1 week EXAM: CT HEAD WITHOUT CONTRAST TECHNIQUE: Contiguous axial images were obtained from the base of the skull through the vertex without intravenous contrast. COMPARISON:  None. FINDINGS: Brain: No evidence of acute infarction, hemorrhage, hydrocephalus, extra-axial collection or mass lesion/mass effect. Vascular: No hyperdense vessel or unexpected calcification. Skull: Normal. Negative for fracture or focal lesion. Sinuses/Orbits: No acute finding. Other: None IMPRESSION: No acute intracranial abnormality. Electronically  Signed   By: Tollie Ethavid  Kwon M.D.   On: 01/13/2017 19:14     PREVIOUS ENDOSCOPIES:            See HPI   Impression / Plan:   Impression: 1.  Severe symptomatic iron deficiency anemia: Patient with dizziness and headache, hemoglobin found to be around 3 with an iron saturation of 1% and TIBC of 595, iron level of 7, patient does report hematochezia 3 weeks ago for 3-4 days, sounds as though patient has a history of iron deficiency anemia with previous EGD and colonoscopy 5 years ago, polyps per the patient and no other findings; concern for upper versus lower GI bleeding 2.  History of colon polyps: 5 years ago per the patient at a [facility] [place]  Plan: 1.  Agree with blood transfusion, patient is receiving his third unit of PRBCs shortly after I left the room.  Patient may need further transfusion after hemoglobin is drawn to check progress. Continue to transfuse <7 2.  Discussed with the patient that he will need an EGD and colonoscopy for further evaluation.  We will wait to do this until he is more hemodynamically stable.  Likely this may not be until Monday. 3.  Continue supportive measures. 4. Will place patient on soft diet for now 5.  Please await final recommendations from Dr. Lavon PaganiniNandigam.  Thank you for your  kind consultation, we will continue to follow.  Violet BaldyJennifer Lynne Lemmon  01/14/2017, 9:40 AM Pager #: [phone]   Attending physician's note   I have taken a history, examined the patient and reviewed the chart. I agree with the Advanced Practitioner's note, impression and recommendations. 41 year old male admitted with severe anemia hemoglobin 3, ferritin 1.  Patient reports having intermittent blood per rectum, sometimes mixed in stools for the past 3-4 years, episodes are getting more frequent and he is noticing more blood with each episode.  He had large volume rectal bleeding about 3 weeks ago.   Patient will need  colonoscopy for further evaluation of chronic intermittent rectal bleeding and severe anemia.  We will also try to obtain his previous endoscopic evaluation reports, EGD and colonoscopy was done in 2013 per patient. We will start bowel prep tomorrow after he finishes blood transfusions with goal hemoglobin greater than 7.  K Scherry RanVeena Envi Eagleson, MD [phone] Mon-Fri 8a-5p [phone] after 5p, weekends, holidays

## 2017-01-15 LAB — BASIC METABOLIC PANEL
Anion gap: 6 (ref 5–15)
BUN: 8 mg/dL (ref 6–20)
CHLORIDE: 107 mmol/L (ref 101–111)
CO2: 24 mmol/L (ref 22–32)
CREATININE: 0.8 mg/dL (ref 0.61–1.24)
Calcium: 8.7 mg/dL — ABNORMAL LOW (ref 8.9–10.3)
GFR calc Af Amer: >60 mL/min (ref >60)
GFR calc non Af Amer: >60 mL/min (ref >60)
Glucose, Bld: 93 mg/dL (ref 65–99)
Potassium: 3.9 mmol/L (ref 3.5–5.1)
SODIUM: 137 mmol/L (ref 135–145)

## 2017-01-15 LAB — CBC WITH DIFFERENTIAL/PLATELET
BASOS ABS: 0 10*3/uL (ref 0.0–0.1)
BASOS PCT: 1 %
Eosinophils Absolute: 0.2 10*3/uL (ref 0.0–0.7)
Eosinophils Relative: 3 %
HEMATOCRIT: 24 % — AB (ref 39.0–52.0)
Hemoglobin: 7.3 g/dL — ABNORMAL LOW (ref 13.0–17.0)
Lymphocytes Relative: 23 %
Lymphs Abs: 1.4 10*3/uL (ref 0.7–4.0)
MCH: 20.2 pg — ABNORMAL LOW (ref 26.0–34.0)
MCHC: 30.4 g/dL (ref 30.0–36.0)
MCV: 66.5 fL — ABNORMAL LOW (ref 78.0–100.0)
MONO ABS: 0.7 10*3/uL (ref 0.1–1.0)
Monocytes Relative: 12 %
NEUTROS ABS: 3.8 10*3/uL (ref 1.7–7.7)
Neutrophils Relative %: 62 %
PLATELETS: 241 10*3/uL (ref 150–400)
RBC: 3.61 MIL/uL — ABNORMAL LOW (ref 4.22–5.81)
RDW: 28.8 % — AB (ref 11.5–15.5)
WBC: 6.1 10*3/uL (ref 4.0–10.5)

## 2017-01-15 LAB — MAGNESIUM: Magnesium: 2 mg/dL (ref 1.7–2.4)

## 2017-01-15 LAB — RPR: RPR: NONREACTIVE

## 2017-01-15 LAB — HEMOGLOBIN AND HEMATOCRIT, BLOOD
HCT: 25.8 % — ABNORMAL LOW (ref 39.0–52.0)
Hemoglobin: 7.9 g/dL — ABNORMAL LOW (ref 13.0–17.0)

## 2017-01-15 LAB — HIV ANTIBODY (ROUTINE TESTING W REFLEX): HIV Screen 4th Generation wRfx: NONREACTIVE

## 2017-01-15 MED ORDER — FERUMOXYTOL INJECTION 510 MG/17 ML
510.0000 mg | Freq: Once | INTRAVENOUS | Status: AC
  Administered 2017-01-15: 510 mg via INTRAVENOUS
  Filled 2017-01-15: qty 17

## 2017-01-15 MED ORDER — PEG-KCL-NACL-NASULF-NA ASC-C 100 G PO SOLR
0.5000 | Freq: Once | ORAL | Status: AC
  Administered 2017-01-15: 100 g via ORAL

## 2017-01-15 MED ORDER — PEG-KCL-NACL-NASULF-NA ASC-C 100 G PO SOLR
0.5000 | Freq: Once | ORAL | Status: AC
  Administered 2017-01-15: 100 g via ORAL
  Filled 2017-01-15: qty 1

## 2017-01-15 MED ORDER — PEG-KCL-NACL-NASULF-NA ASC-C 100 G PO SOLR: 1.0000 | Freq: Once | ORAL | Status: DC

## 2017-01-15 NOTE — Progress Notes (Addendum)
Progress Note   Subjective  Chief Complaint: Severe symptomatic iron deficiency anemia  Patient found laying in bed this morning comfortably, he is doing well.  Overall the patient "feels fine".  Patient has been up and down to the bathroom and continues with regular bowel movements and has seen no further bleeding.   Objective   Vital signs in last 24 hours: Temp:  [98.8 F (37.1 C)-100 F (37.8 C)] 98.8 F (37.1 C) (11/25 0548) Pulse Rate:  [64-84] 71 (11/25 0548) Resp:  [14-16] 16 (11/25 0548) BP: (114-127)/(54-61) 127/61 (11/25 0548) SpO2:  [100 %] 100 % (11/25 0548) Last BM Date: 01/13/17 General:    AA male in NAD Heart:  Regular rate and rhythm; no murmurs Lungs: Respirations even and unlabored, lungs CTA bilaterally Abdomen:  Soft, nontender and nondistended. Normal bowel sounds. Extremities:  Without edema. Neurologic:  Alert and oriented,  grossly normal neurologically. Psych:  Cooperative. Normal mood and affect.  Intake/Output from previous day: 11/24 0701 - 11/25 0700 In: 976 [P.O.:240; Blood:736] Out: -   Lab Results: Recent Labs    01/13/17 1820 01/14/17 1553 01/15/17 0017 01/15/17 0412  WBC 5.4 6.1  --  6.1  HGB 3.2* 6.6* 7.9* 7.3*  HCT 12.2* 21.8* 25.8* 24.0*  PLT 334 266  --  241   BMET Recent Labs    01/13/17 1820 01/14/17 1553 01/15/17 0412  NA 135 137 137  K 3.5 3.9 3.9  CL 104 105 107  CO2 25 26 24   GLUCOSE 108* 89 93  BUN 11 10 8   CREATININE 0.86 0.85 0.80  CALCIUM 8.6* 8.8* 8.7*   LFT Recent Labs    01/14/17 1553  PROT 7.0  ALBUMIN 3.7  AST 17  ALT 14*  ALKPHOS 89  BILITOT 2.3*   PT/INR Recent Labs    01/13/17 2050  LABPROT 13.4  INR 1.03    Studies/Results: Dg Chest 2 View  Result Date: 01/13/2017 CLINICAL DATA:  Chest pain, worse today. EXAM: CHEST  2 VIEW COMPARISON:  None. FINDINGS: Mild cardiomegaly for age. Normal mediastinal contours. No pulmonary edema. No consolidation, pleural fluid or  pneumothorax. No acute osseous abnormality. IMPRESSION: Mild cardiomegaly. No congestive failure or acute pulmonary process. Electronically Signed   By: Rubye OaksMelanie  Ehinger M.D.   On: 01/13/2017 19:12   Ct Head Wo Contrast  Result Date: 01/13/2017 CLINICAL DATA:  Headache and dizziness x1 week EXAM: CT HEAD WITHOUT CONTRAST TECHNIQUE: Contiguous axial images were obtained from the base of the skull through the vertex without intravenous contrast. COMPARISON:  None. FINDINGS: Brain: No evidence of acute infarction, hemorrhage, hydrocephalus, extra-axial collection or mass lesion/mass effect. Vascular: No hyperdense vessel or unexpected calcification. Skull: Normal. Negative for fracture or focal lesion. Sinuses/Orbits: No acute finding. Other: None IMPRESSION: No acute intracranial abnormality. Electronically Signed   By: Tollie Ethavid  Kwon M.D.   On: 01/13/2017 19:14     Assessment / Plan:    Assessment: 1.  Severe symptomatic iron deficiency anemia: Hemoglobin now 7.3 after 4 units of PRBCs, patient feels well with no further signs of acute GI bleed 2.  History of colon polyps  Plan: 1.  Planning for colonoscopy tomorrow.  Discussed risk, benefits, limitations and alternatives and patient agrees to proceed. 2.  Patient changed to a clear liquid diet today and will start movie prep tonight 3.  Continue to monitor hemoglobin transfusion need at 7 4.  Please do any further recommendations from Dr. Lavon PaganiniNandigam  Thank you for the  kind consultation, we will continue to follow.    LOS: 2 days   Jennifer Lynne Lemmon  01/15/2017, 9:20 AM  Pager # [phone]   Attending physician's note   I have taken an interval history, reviewed the chart and examined the patient. I agree with the Advanced Practitioner's note, impression and recommendations.  Plan for colonoscopy tomorrow to evaluate intermittent rectal bleeding and severe iron deficiency anemia The risks and benefits as well as alternatives of  endoscopic procedure(s) have been discussed and reviewed. All questions answered. The patient agrees to proceed.  Clear liquids and start bowel prep We will need to obtain records of EGD and [facility] at [facility]  [place] in [facility] tomorrow  K Veena Mikaylah Libbey, MD [phone] Mon-Fri 8a-5p [phone] after 5p, weekends, holidays   

## 2017-01-15 NOTE — H&P (View-Only) (Signed)
Progress Note   Subjective  Chief Complaint: Severe symptomatic iron deficiency anemia  Patient found laying in bed this morning comfortably, he is doing well.  Overall the patient "feels fine".  Patient has been up and down to the bathroom and continues with regular bowel movements and has seen no further bleeding.   Objective   Vital signs in last 24 hours: Temp:  [98.8 F (37.1 C)-100 F (37.8 C)] 98.8 F (37.1 C) (11/25 0548) Pulse Rate:  [64-84] 71 (11/25 0548) Resp:  [14-16] 16 (11/25 0548) BP: (114-127)/(54-61) 127/61 (11/25 0548) SpO2:  [100 %] 100 % (11/25 0548) Last BM Date: 01/13/17 General:    AA male in NAD Heart:  Regular rate and rhythm; no murmurs Lungs: Respirations even and unlabored, lungs CTA bilaterally Abdomen:  Soft, nontender and nondistended. Normal bowel sounds. Extremities:  Without edema. Neurologic:  Alert and oriented,  grossly normal neurologically. Psych:  Cooperative. Normal mood and affect.  Intake/Output from previous day: 11/24 0701 - 11/25 0700 In: 976 [P.O.:240; Blood:736] Out: -   Lab Results: Recent Labs    01/13/17 1820 01/14/17 1553 01/15/17 0017 01/15/17 0412  WBC 5.4 6.1  --  6.1  HGB 3.2* 6.6* 7.9* 7.3*  HCT 12.2* 21.8* 25.8* 24.0*  PLT 334 266  --  241   BMET Recent Labs    01/13/17 1820 01/14/17 1553 01/15/17 0412  NA 135 137 137  K 3.5 3.9 3.9  CL 104 105 107  CO2 25 26 24   GLUCOSE 108* 89 93  BUN 11 10 8   CREATININE 0.86 0.85 0.80  CALCIUM 8.6* 8.8* 8.7*   LFT Recent Labs    01/14/17 1553  PROT 7.0  ALBUMIN 3.7  AST 17  ALT 14*  ALKPHOS 89  BILITOT 2.3*   PT/INR Recent Labs    01/13/17 2050  LABPROT 13.4  INR 1.03    Studies/Results: Dg Chest 2 View  Result Date: 01/13/2017 CLINICAL DATA:  Chest pain, worse today. EXAM: CHEST  2 VIEW COMPARISON:  None. FINDINGS: Mild cardiomegaly for age. Normal mediastinal contours. No pulmonary edema. No consolidation, pleural fluid or  pneumothorax. No acute osseous abnormality. IMPRESSION: Mild cardiomegaly. No congestive failure or acute pulmonary process. Electronically Signed   By: Rubye OaksMelanie  Ehinger M.D.   On: 01/13/2017 19:12   Ct Head Wo Contrast  Result Date: 01/13/2017 CLINICAL DATA:  Headache and dizziness x1 week EXAM: CT HEAD WITHOUT CONTRAST TECHNIQUE: Contiguous axial images were obtained from the base of the skull through the vertex without intravenous contrast. COMPARISON:  None. FINDINGS: Brain: No evidence of acute infarction, hemorrhage, hydrocephalus, extra-axial collection or mass lesion/mass effect. Vascular: No hyperdense vessel or unexpected calcification. Skull: Normal. Negative for fracture or focal lesion. Sinuses/Orbits: No acute finding. Other: None IMPRESSION: No acute intracranial abnormality. Electronically Signed   By: Tollie Ethavid  Kwon M.D.   On: 01/13/2017 19:14     Assessment / Plan:    Assessment: 1.  Severe symptomatic iron deficiency anemia: Hemoglobin now 7.3 after 4 units of PRBCs, patient feels well with no further signs of acute GI bleed 2.  History of colon polyps  Plan: 1.  Planning for colonoscopy tomorrow.  Discussed risk, benefits, limitations and alternatives and patient agrees to proceed. 2.  Patient changed to a clear liquid diet today and will start movie prep tonight 3.  Continue to monitor hemoglobin transfusion need at 7 4.  Please do any further recommendations from Dr. Lavon PaganiniNandigam  Thank you for the  kind consultation, we will continue to follow.    LOS: 2 days   Unk LightningJennifer Lynne Lemmon  01/15/2017, 9:20 AM  Pager # [phone]   Attending physician's note   I have taken an interval history, reviewed the chart and examined the patient. I agree with the Advanced Practitioner's note, impression and recommendations.  Plan for colonoscopy tomorrow to evaluate intermittent rectal bleeding and severe iron deficiency anemia The risks and benefits as well as alternatives of  endoscopic procedure(s) have been discussed and reviewed. All questions answered. The patient agrees to proceed.  Clear liquids and start bowel prep We will need to obtain records of [facility]  [place] [facility] tomorrow  K Scherry RanVeena Tanesha Arambula, MD [phone] Mon-Fri 8a-5p [phone] after 5p, weekends, holidays

## 2017-01-15 NOTE — Progress Notes (Signed)
PROGRESS NOTE  Kandace ParkinsJoseph L Smolinski WUJ:[id number] DOB: 07/09/1975 DOA: 01/13/2017 PCP: Patient, No Pcp Per  HPI/Recap of past 24 hours:  Feeling better, denies pain, no sob, no fever, no active bleed  Assessment/Plan: Active Problems:   Symptomatic anemia  Blood loss anemia, symptomatic anemia -hgb 3.2 on admission, plt and inr unremarkable -prbc transfusion, keep hgb >7,  -Likely gi loss per history  -Gi consulted, colonoscopy on Monday. Case discussed with GI Dr Lavon PaganiniNandigam.  Severe iron deficiency: Iv iron, plan to d/c on oral iron.  Mild heart murmur:  Flow murmur from severe anemia? no chest pain, no sob, no syncope.   need outpatient follow up.  Tele unremarkable, hgb improved, d/c tele   H/o alcohol use, report drink 1-2 beer on weekend  tobacco use; one pack a day, cessation education provided. He decline nicotine patch.    Code Status: full  Family Communication: patient   Disposition Plan: pending colonoscopy result, need gi clearance for discharge   Consultants:  GI  Procedures:  colonoscopy planned on monday  Antibiotics:  none   Objective: BP 127/61 (BP Location: Left Arm)   Pulse 71   Temp 98.8 F (37.1 C) (Oral)   Resp 16   Ht 6\' 1"  (1.854 m)   Wt 74.8 kg (165 lb)   SpO2 100%   BMI 21.77 kg/m   Intake/Output Summary (Last 24 hours) at 01/15/2017 [id number] Last data filed at 01/15/2017 0600 Gross per 24 hour  Intake 976 ml  Output -  Net 976 ml   Filed Weights   01/13/17 1725  Weight: 74.8 kg (165 lb)    Exam: Patient is examined daily including today on 01/15/2017, exams remain the same as of yesterday except that has changed    General:  NAD  Cardiovascular: RRR, mild systolic precordial murmur  Respiratory: CTABL  Abdomen: Soft/ND/NT, positive BS  Musculoskeletal: No Edema  Neuro: alert, oriented   Data Reviewed: Basic Metabolic Panel: Recent Labs  Lab 01/13/17 1820 01/14/17 1553 01/15/17 0412  NA 135 137 137    K 3.5 3.9 3.9  CL 104 105 107  CO2 25 26 24   GLUCOSE 108* 89 93  BUN 11 10 8   CREATININE 0.86 0.85 0.80  CALCIUM 8.6* 8.8* 8.7*  MG  --   --  2.0   Liver Function Tests: Recent Labs  Lab 01/13/17 1820 01/14/17 1553  AST 16 17  ALT 11* 14*  ALKPHOS 79 89  BILITOT 0.2* 2.3*  PROT 6.9 7.0  ALBUMIN 3.7 3.7   Recent Labs  Lab 01/13/17 1820  LIPASE 26   No results for input(s): AMMONIA in the last 168 hours. CBC: Recent Labs  Lab 01/13/17 1820 01/14/17 1553 01/15/17 0017 01/15/17 0412  WBC 5.4 6.1  --  6.1  NEUTROABS 2.9  --   --  3.8  HGB 3.2* 6.6* 7.9* 7.3*  HCT 12.2* 21.8* 25.8* 24.0*  MCV 54.2* 64.9*  --  66.5*  PLT 334 266  --  241   Cardiac Enzymes:   Recent Labs  Lab 01/13/17 1820  TROPONINI <0.03   BNP (last 3 results) No results for input(s): BNP in the last 8760 hours.  ProBNP (last 3 results) No results for input(s): PROBNP in the last 8760 hours.  CBG: No results for input(s): GLUCAP in the last 168 hours.  No results found for this or any previous visit (from the past 240 hour(s)).   Studies: No results found.  Scheduled Meds: .  mouth rinse  15 mL Mouth Rinse BID  . sodium chloride flush  3 mL Intravenous Q12H    Continuous Infusions: . sodium chloride    . sodium chloride        I have personally reviewed and interpreted on  01/15/2017 daily labs, tele strips, imagings as discussed above under date review session and assessment and plans.  I reviewed all nursing notes,  consultant notes,  vitals, pertinent old records  I have discussed plan of care as described above with RN , patient on 01/15/2017   Albertine GratesFang Gared Gillie MD, PhD  [facility] Pager [phone]. If 7PM-7AM, please contact night-coverage at [email or web address], password [facility] 01/15/2017, 8:12 AM  LOS: 2 days

## 2017-01-16 ENCOUNTER — Inpatient Hospital Stay (HOSPITAL_COMMUNITY)

## 2017-01-16 ENCOUNTER — Inpatient Hospital Stay (HOSPITAL_COMMUNITY): Admitting: Certified Registered Nurse Anesthetist

## 2017-01-16 ENCOUNTER — Encounter (HOSPITAL_COMMUNITY)
Admission: EM | Disposition: A | Discharge status: Home / Self Care | Payer: Self-pay | Source: Home / Self Care | Attending: Internal Medicine

## 2017-01-16 ENCOUNTER — Encounter (HOSPITAL_COMMUNITY): Payer: Self-pay | Admitting: Certified Registered Nurse Anesthetist

## 2017-01-16 DIAGNOSIS — D500 Iron deficiency anemia secondary to blood loss (chronic): Secondary | ICD-10-CM

## 2017-01-16 DIAGNOSIS — K644 Residual hemorrhoidal skin tags: Secondary | ICD-10-CM

## 2017-01-16 DIAGNOSIS — K921 Melena: Secondary | ICD-10-CM

## 2017-01-16 DIAGNOSIS — K640 First degree hemorrhoids: Secondary | ICD-10-CM

## 2017-01-16 DIAGNOSIS — I509 Heart failure, unspecified: Secondary | ICD-10-CM

## 2017-01-16 HISTORY — PX: COLONOSCOPY WITH PROPOFOL: SHX5780

## 2017-01-16 LAB — CBC WITH DIFFERENTIAL/PLATELET
BASOS PCT: 1 %
Basophils Absolute: 0.1 10*3/uL (ref 0.0–0.1)
EOS PCT: 3 %
Eosinophils Absolute: 0.2 10*3/uL (ref 0.0–0.7)
HCT: 24.7 % — ABNORMAL LOW (ref 39.0–52.0)
Hemoglobin: 7.4 g/dL — ABNORMAL LOW (ref 13.0–17.0)
LYMPHS ABS: 1.7 10*3/uL (ref 0.7–4.0)
Lymphocytes Relative: 26 %
MCH: 20.1 pg — AB (ref 26.0–34.0)
MCHC: 30 g/dL (ref 30.0–36.0)
MCV: 67.1 fL — AB (ref 78.0–100.0)
MONO ABS: 0.8 10*3/uL (ref 0.1–1.0)
Monocytes Relative: 12 %
NEUTROS ABS: 3.6 10*3/uL (ref 1.7–7.7)
Neutrophils Relative %: 58 %
PLATELETS: 261 10*3/uL (ref 150–400)
RBC: 3.68 MIL/uL — ABNORMAL LOW (ref 4.22–5.81)
RDW: 30.1 % — AB (ref 11.5–15.5)
WBC: 6.4 10*3/uL (ref 4.0–10.5)

## 2017-01-16 LAB — TYPE AND SCREEN
ABO/RH(D): A POS
Antibody Screen: NEGATIVE
UNIT DIVISION: 0
UNIT DIVISION: 0
UNIT DIVISION: 0
Unit division: 0

## 2017-01-16 LAB — BPAM RBC
BLOOD PRODUCT EXPIRATION DATE: 201812262359
BLOOD PRODUCT EXPIRATION DATE: 201812102359
Blood Product Expiration Date: 201812102359
Blood Product Expiration Date: 201812122359
ISSUE DATE / TIME: 201811232053
ISSUE DATE / TIME: 201811240349
ISSUE DATE / TIME: 201811240938
ISSUE DATE / TIME: 201811241746
UNIT TYPE AND RH: 6200
UNIT TYPE AND RH: 6200
Unit Type and Rh: 9500
Unit Type and Rh: 6200

## 2017-01-16 LAB — COMPREHENSIVE METABOLIC PANEL
ALT: 14 U/L — AB (ref 17–63)
AST: 17 U/L (ref 15–41)
Albumin: 3.7 g/dL (ref 3.5–5.0)
Alkaline Phosphatase: 88 U/L (ref 38–126)
Anion gap: 8 (ref 5–15)
BUN: 6 mg/dL (ref 6–20)
CHLORIDE: 108 mmol/L (ref 101–111)
CO2: 21 mmol/L — ABNORMAL LOW (ref 22–32)
CREATININE: 0.81 mg/dL (ref 0.61–1.24)
Calcium: 9.1 mg/dL (ref 8.9–10.3)
Glucose, Bld: 88 mg/dL (ref 65–99)
POTASSIUM: 4 mmol/L (ref 3.5–5.1)
Sodium: 137 mmol/L (ref 135–145)
Total Bilirubin: 1 mg/dL (ref 0.3–1.2)
Total Protein: 6.7 g/dL (ref 6.5–8.1)

## 2017-01-16 LAB — HEMOGLOBIN AND HEMATOCRIT, BLOOD
HCT: 24.3 % — ABNORMAL LOW (ref 39.0–52.0)
HEMOGLOBIN: 7.3 g/dL — AB (ref 13.0–17.0)

## 2017-01-16 LAB — ECHOCARDIOGRAM COMPLETE
Height: 73 in
WEIGHTICAEL: 2640 [oz_av]

## 2017-01-16 LAB — TSH: TSH: 1.155 u[IU]/mL (ref 0.350–4.500)

## 2017-01-16 SURGERY — COLONOSCOPY WITH PROPOFOL
Anesthesia: Monitor Anesthesia Care

## 2017-01-16 MED ORDER — PROPOFOL 10 MG/ML IV BOLUS
INTRAVENOUS | Status: AC
  Filled 2017-01-16: qty 60

## 2017-01-16 MED ORDER — FERROUS SULFATE 325 (65 FE) MG PO TABS
325.0000 mg | ORAL_TABLET | Freq: Three times a day (TID) | ORAL | Status: DC
  Administered 2017-01-17: 325 mg via ORAL
  Filled 2017-01-16: qty 1

## 2017-01-16 MED ORDER — LACTATED RINGERS IV SOLN
INTRAVENOUS | Status: DC
  Administered 2017-01-16: 1000 mL via INTRAVENOUS

## 2017-01-16 MED ORDER — PROPOFOL 10 MG/ML IV BOLUS
INTRAVENOUS | Status: DC | PRN
  Administered 2017-01-16 (×3): 20 mg via INTRAVENOUS
  Administered 2017-01-16: 10 mg via INTRAVENOUS
  Administered 2017-01-16: 20 mg via INTRAVENOUS
  Administered 2017-01-16: 10 mg via INTRAVENOUS

## 2017-01-16 MED ORDER — LIDOCAINE 2% (20 MG/ML) 5 ML SYRINGE
INTRAMUSCULAR | Status: AC
  Filled 2017-01-16: qty 5

## 2017-01-16 MED ORDER — PROPOFOL 500 MG/50ML IV EMUL
INTRAVENOUS | Status: DC | PRN
  Administered 2017-01-16: 75 ug/kg/min via INTRAVENOUS

## 2017-01-16 MED ORDER — ONDANSETRON HCL 4 MG/2ML IJ SOLN
INTRAMUSCULAR | Status: AC
  Filled 2017-01-16: qty 2

## 2017-01-16 MED ORDER — LIDOCAINE 2% (20 MG/ML) 5 ML SYRINGE
INTRAMUSCULAR | Status: DC | PRN
  Administered 2017-01-16: 100 mg via INTRAVENOUS

## 2017-01-16 MED ORDER — ONDANSETRON HCL 4 MG/2ML IJ SOLN
INTRAMUSCULAR | Status: DC | PRN
  Administered 2017-01-16: 4 mg via INTRAVENOUS

## 2017-01-16 MED ORDER — DOCUSATE SODIUM 100 MG PO CAPS
200.0000 mg | ORAL_CAPSULE | Freq: Two times a day (BID) | ORAL | Status: DC
  Filled 2017-01-16: qty 2

## 2017-01-16 SURGICAL SUPPLY — 21 items

## 2017-01-16 NOTE — Progress Notes (Signed)
  Echocardiogram 2D Echocardiogram has been performed.  Nolon RodBrown, Tony 01/16/2017, 2:44 PM

## 2017-01-16 NOTE — Plan of Care (Signed)
Discussed procedure with patient.  Patient completed bowel prep and remains npo for test today.

## 2017-01-16 NOTE — Op Note (Addendum)
[facility] Patient Name: Charles Franco Procedure Date: 01/16/2017 MRN: [id number] Attending MD: Meryl DareMalcolm T Allisa Einspahr , MD Date of Birth: 03/12/1975 CSN: [id number] Age: 41 Admit Type: Inpatient Procedure:                Colonoscopy Indications:              Hematochezia, Iron deficiency anemia Providers:                Venita LickMalcolm T. Russella DarStark, MD, Weston SettleBethany Thacker, RN, Harrington ChallengerHope                            Parker, Technician Referring MD:              Medicines:                Monitored Anesthesia Care Complications:            No immediate complications. Estimated blood loss:                            None. Estimated Blood Loss:     Estimated blood loss: none. Procedure:                Pre-Anesthesia Assessment:                           - Prior to the procedure, a History and Physical                            was performed, and patient medications and                            allergies were reviewed. The patient's tolerance of                            previous anesthesia was also reviewed. The risks                            and benefits of the procedure and the sedation                            options and risks were discussed with the patient.                            All questions were answered, and informed consent                            was obtained. Prior Anticoagulants: The patient has                            taken no previous anticoagulant or antiplatelet                            agents. ASA Grade Assessment: II - A patient with                            mild systemic  disease. After reviewing the risks                            and benefits, the patient was deemed in                            satisfactory condition to undergo the procedure.                           After obtaining informed consent, the colonoscope                            was passed under direct vision. Throughout the                            procedure, the patient's blood  pressure, pulse, and                            oxygen saturations were monitored continuously. The                            EC-[id number] ([id number] scope was introduced through                            the anus and advanced to the the cecum, identified                            by appendiceal orifice and ileocecal valve. The                            terminal ileum, ileocecal valve, appendiceal                            orifice, and rectum were photographed. The quality                            of the bowel preparation was good. The colonoscopy                            was performed without difficulty. The patient                            tolerated the procedure well. Scope In: 10:04:32 AM Scope Out: 10:18:47 AM Scope Withdrawal Time: 0 hours 11 minutes 7 seconds  Total Procedure Duration: 0 hours 14 minutes 15 seconds  Findings:      The perianal exam findings include small non-thrombosed external       hemorrhoids.      The terminal ileum appeared normal.      Internal hemorrhoids were found during retroflexion. The hemorrhoids       were large and Grade III (internal hemorrhoids that prolapse but require       manual reduction).      The exam was otherwise without abnormality on direct and retroflexion       views. Impression:               -  Non-thrombosed external hemorrhoids found on                            perianal exam.                           - Large, Grade III internal hemorrhoids.                           - The examination was otherwise normal on direct                            and retroflexion views.                           - No specimens collected. Moderate Sedation:      N/A- Per Anesthesia Care Recommendation:           - Repeat colonoscopy in 10 years for screening                            purposes, Dr. Lavon Paganini.                           - Patient has a contact number available for                            emergencies. The signs and symptoms  of potential                            delayed complications were discussed with the                            patient. Return to normal activities tomorrow.                            Written discharge instructions were provided to the                            patient.                           - Resume previous diet.                           - Continue present medications.                           - Consider IV Fe replacement vs oral                           - Internal hemorrhoids are the source of                            hematochezia and are the likely source of iron  deficiency.                           - Outpatient surgical consult for consideration of                            hemorrhoid banding vs hemorrhoidectomy.                           - Outpatient GI follow up with Dr. Lavon Paganini or with                            [facility] as needed. Procedure Code(s):        --- Professional ---                           [phone], Colonoscopy, flexible; diagnostic, including                            collection of specimen(s) by brushing or washing,                            when performed (separate procedure) Diagnosis Code(s):        --- Professional ---                           K64.0, First degree hemorrhoids                           K64.4, Residual hemorrhoidal skin tags                           K92.1, Melena (includes Hematochezia)                           D50.9, Iron deficiency anemia, unspecified CPT copyright 2016 [employer]. All rights reserved. The codes documented in this report are preliminary and upon coder review may  be revised to meet current compliance requirements. Meryl Dare, MD 01/16/2017 10:25:54 AM This report has been signed electronically. Number of Addenda: 0

## 2017-01-16 NOTE — Progress Notes (Signed)
PROGRESS NOTE  Kandace ParkinsJoseph L Okray VHQ:[id number] DOB: 07/24/1975 DOA: 01/13/2017 PCP: Patient, No Pcp Per  Subjective - Patient in bed, appears comfortable, denies any headache, no fever, no chest pain or pressure, no shortness of breath , no abdominal pain. No focal weakness.   Assessment/Plan:  Blood loss anemia, symptomatic anemia -post 4 units of packed RBC time to begin upon admission, hemoglobin 7, ongoing, GI, underwent colonoscopy on 01/17/2019, will get IV Feraheme along with oral iron supplementation, repeat H&H in the morning if stays stable discharge home with outpatient GI and general surgery follow-up for eventual hemorrhoid banding.   Severe iron deficiency: Transfused, received IV Feraheme, oral iron supplementation for now and monitor H&H.  Mild heart murmur:  Flow murmur from severe anemia?  no chest pain, no sob, no syncope.  We will check echocardiogram.   H/o alcohol use, report drink 1-2 beer on weekend  tobacco use; one pack a day, cessation education provided. He decline nicotine patch.    Code Status: full  Family Communication: patient   Disposition Plan: pending colonoscopy result, need gi clearance for discharge   Consultants:  GI  Procedures:  Colonoscopy - hemorrhoids, no source of active bleeding.  Antibiotics:  none   Objective: BP 120/61 (BP Location: Right Arm)   Pulse 63   Temp 98.1 F (36.7 C) (Oral)   Resp 14   Ht 6\' 1"  (1.854 m)   Wt 74.8 kg (165 lb)   SpO2 100%   BMI 21.77 kg/m    Intake/Output Summary (Last 24 hours) at 01/16/2017 1139 Last data filed at 01/16/2017 1019 Gross per 24 hour  Intake 920 ml  Output 7 ml  Net 913 ml   Filed Weights   01/13/17 1725 01/16/17 0935  Weight: 74.8 kg (165 lb) 74.8 kg (165 lb)      Exam:  Awake Alert, Oriented X 3, No new F.N deficits, Normal affect [place].AT,PERRAL Supple Neck,No JVD, No cervical lymphadenopathy appriciated.  Symmetrical Chest wall movement, Good air  movement bilaterally, CTAB RRR,No Gallops, Rubs , soft MV murmur, No Parasternal Heave +ve B.Sounds, Abd Soft, No tenderness, No organomegaly appriciated, No rebound - guarding or rigidity. No Cyanosis, Clubbing or edema, No new Rash or bruise   Data Reviewed:  Basic Metabolic Panel:  Recent Labs  Lab 01/13/17 1820 01/14/17 1553 01/15/17 0412 01/16/17 0500  NA 135 137 137 137  K 3.5 3.9 3.9 4.0  CL 104 105 107 108  CO2 25 26 24  21*  GLUCOSE 108* 89 93 88  BUN 11 10 8 6   CREATININE 0.86 0.85 0.80 0.81  CALCIUM 8.6* 8.8* 8.7* 9.1  MG  --   --  2.0  --    Liver Function Tests: Recent Labs  Lab 01/13/17 1820 01/14/17 1553 01/16/17 0500  AST 16 17 17   ALT 11* 14* 14*  ALKPHOS 79 89 88  BILITOT 0.2* 2.3* 1.0  PROT 6.9 7.0 6.7  ALBUMIN 3.7 3.7 3.7   Recent Labs  Lab 01/13/17 1820  LIPASE 26   No results for input(s): AMMONIA in the last 168 hours. CBC: Recent Labs  Lab 01/13/17 1820 01/14/17 1553 01/15/17 0017 01/15/17 0412 01/16/17 0500  WBC 5.4 6.1  --  6.1 6.4  NEUTROABS 2.9  --   --  3.8 3.6  HGB 3.2* 6.6* 7.9* 7.3* 7.4*  HCT 12.2* 21.8* 25.8* 24.0* 24.7*  MCV 54.2* 64.9*  --  66.5* 67.1*  PLT 334 266  --  241 261  Cardiac Enzymes:   Recent Labs  Lab 01/13/17 1820  TROPONINI <0.03   BNP (last 3 results) No results for input(s): BNP in the last 8760 hours.  ProBNP (last 3 results) No results for input(s): PROBNP in the last 8760 hours.  CBG: No results for input(s): GLUCAP in the last 168 hours.  No results found for this or any previous visit (from the past 240 hour(s)).   Studies: No results found.  Scheduled Meds: . [START ON 01/17/2017] docusate sodium  200 mg Oral BID  . [START ON 01/17/2017] ferrous sulfate  325 mg Oral TID WC  . mouth rinse  15 mL Mouth Rinse BID    Continuous Infusions:     I have personally reviewed and interpreted on  01/16/2017 daily labs, tele strips, imagings as discussed above under date review  session and assessment and plans.  I reviewed all nursing notes,  consultant notes,  vitals, pertinent old records  I have discussed plan of care as described above with RN , patient on 01/16/2017  Signature  Susa RaringPrashant Fermon Ureta M.D on 01/16/2017 at 11:39 AM  Between 7am to 7pm - Pager - [phone] ( page via [email or web address], text pages only, please mention full 10 digit call back number).  After 7pm go to [email or web address] - password [email or web address]

## 2017-01-16 NOTE — Anesthesia Preprocedure Evaluation (Addendum)
Anesthesia Evaluation  Patient identified by MRN, date of birth, ID band Patient awake    Reviewed: Allergy & Precautions, NPO status , Patient's Chart, lab work & pertinent test results  Airway Mallampati: I  TM Distance: >3 FB Neck ROM: Full    Dental  (+) Teeth Intact   Pulmonary neg pulmonary ROS, Current Smoker,    breath sounds clear to auscultation       Cardiovascular negative cardio ROS   Rhythm:Regular Rate:Normal     Neuro/Psych negative neurological ROS  negative psych ROS   GI/Hepatic negative GI ROS, Neg liver ROS,   Endo/Other  negative endocrine ROS  Renal/GU negative Renal ROS  negative genitourinary   Musculoskeletal negative musculoskeletal ROS (+)   Abdominal   Peds negative pediatric ROS (+)  Hematology  (+) Blood dyscrasia, anemia , Severe anemia and PRBCs given   Anesthesia Other Findings   Reproductive/Obstetrics negative OB ROS                            Anesthesia Physical Anesthesia Plan  ASA: II  Anesthesia Plan: MAC   Post-op Pain Management:    Induction: Intravenous  PONV Risk Score and Plan: 0  Airway Management Planned: Simple Face Mask  Additional Equipment:   Intra-op Plan:   Post-operative Plan:   Informed Consent: I have reviewed the patients History and Physical, chart, labs and discussed the procedure including the risks, benefits and alternatives for the proposed anesthesia with the patient or authorized representative who has indicated his/her understanding and acceptance.     Plan Discussed with: CRNA  Anesthesia Plan Comments:         Anesthesia Quick Evaluation

## 2017-01-16 NOTE — Interval H&P Note (Signed)
History and Physical Interval Note:  01/16/2017 9:50 AM  Charles ParkinsJoseph L Franco  has presented today for surgery, with the diagnosis of Anemia, blood in stool  The various methods of treatment have been discussed with the patient and family. After consideration of risks, benefits and other options for treatment, the patient has consented to  Procedure(s): COLONOSCOPY WITH PROPOFOL (N/A) as a surgical intervention .  The patient's history has been reviewed, patient examined, no change in status, stable for surgery.  I have reviewed the patient's chart and labs.  Questions were answered to the patient's satisfaction.     Venita LickMalcolm T. Russella DarStark

## 2017-01-16 NOTE — Transfer of Care (Signed)
Immediate Anesthesia Transfer of Care Note  Patient: Charles Franco  Procedure(s) Performed: COLONOSCOPY WITH PROPOFOL (N/A )  Patient Location: Endoscopy Unit  Anesthesia Type:MAC  Level of Consciousness: drowsy and patient cooperative  Airway & Oxygen Therapy: Patient Spontanous Breathing and Patient connected to face mask  Post-op Assessment: Report given to RN and Post -op Vital signs reviewed and stable  Post vital signs: Reviewed and stable  Last Vitals:  Vitals:   01/16/17 0512 01/16/17 0935  BP: (!) 126/59 (!) 132/58  Pulse: 80 (!) 59  Resp: 18 12  Temp: 37.2 C 37.5 C  SpO2: 100% 100%    Last Pain:  Vitals:   01/16/17 0935  TempSrc: Oral  PainSc:          Complications: No apparent anesthesia complications

## 2017-01-16 NOTE — Care Management Note (Signed)
Case Management Note  Patient Details  Name: Charles Franco MRN: [name] Date of Birth: 07/20/1975  Subjective/Objective: 41 y/o m admitted w/anemia. From home.                   Action/Plan:d/c plan home.   Expected Discharge Date:                  Expected Discharge Plan:  Home/Self Care  In-House Referral:     Discharge planning Services  CM Consult  Post Acute Care Choice:    Choice offered to:     DME Arranged:    DME Agency:     HH Arranged:    HH Agency:     Status of Service:  In process, will continue to follow  If discussed at Long Length of Stay Meetings, dates discussed:    Additional Comments:  Lanier ClamMahabir, Sary Bogie, RN 01/16/2017, 11:34 AM

## 2017-01-16 NOTE — Progress Notes (Signed)
Chaplain responding to request for advance directive.   Provided education around advance directive.  Mr Charles Franco states he has power of attorney paperwork he filled out through [employer].  He was not aware that advance directive covers same.  He does not wish to fill this paperwork out again.  Chaplain advised him on bringing existing paperwork to hospital to be placed on file.     [facility] Chaplain Burnis KingfisherMatthew Yehya Brendle, MDiv

## 2017-01-16 NOTE — Anesthesia Postprocedure Evaluation (Signed)
Anesthesia Post Note  Patient: TAHER VANNOTE  Procedure(s) Performed: COLONOSCOPY WITH PROPOFOL (N/A )     Patient location during evaluation: Endoscopy Anesthesia Type: MAC Level of consciousness: awake and alert Pain management: pain level controlled Vital Signs Assessment: post-procedure vital signs reviewed and stable Respiratory status: spontaneous breathing, nonlabored ventilation, respiratory function stable and patient connected to nasal cannula oxygen Cardiovascular status: stable and blood pressure returned to baseline Postop Assessment: no apparent nausea or vomiting Anesthetic complications: no    Last Vitals:  Vitals:   01/16/17 1040 01/16/17 1057  BP: (!) 130/55 120/61  Pulse: (!) 52 63  Resp: 11 14  Temp:  36.7 C  SpO2: 100% 100%    Last Pain:  Vitals:   01/16/17 1057  TempSrc: Oral  PainSc:                  Quaniya Damas,JAMES TERRILL

## 2017-01-17 MED ORDER — FERROUS SULFATE 325 (65 FE) MG PO TABS: 325.0000 mg | ORAL_TABLET | Freq: Two times a day (BID) | ORAL | 0 refills | Status: AC

## 2017-01-17 MED ORDER — DOCUSATE SODIUM 100 MG PO CAPS: 200.0000 mg | ORAL_CAPSULE | Freq: Two times a day (BID) | ORAL | 0 refills | Status: AC

## 2017-01-17 NOTE — Care Management Note (Signed)
Case Management Note  Patient Details  Name: Kandace ParkinsJoseph L Hurd MRN: [id number] Date of Birth: 03/10/1975  Subjective/Objective: No CM needs or orders.                   Action/Plan:d/c home.   Expected Discharge Date:  01/17/17               Expected Discharge Plan:  Home/Self Care  In-House Referral:     Discharge planning Services  CM Consult  Post Acute Care Choice:    Choice offered to:     DME Arranged:    DME Agency:     HH Arranged:    HH Agency:     Status of Service:  Completed, signed off  If discussed at [employer] Length of Stay Meetings, dates discussed:    Additional Comments:  Lanier ClamMahabir, Axten Pascucci, RN 01/17/2017, 11:42 AM

## 2017-01-17 NOTE — Discharge Instructions (Signed)
Follow with Primary MD in 7 days   Get CBC, CMP, anemia panel checked  by Primary MD in 5-7 days    Activity: As tolerated with Full fall precautions use walker/cane & assistance as needed  Disposition Home    Diet:  Heart Healthy   For Heart failure patients - Check your Weight same time everyday, if you gain over 2 pounds, or you develop in leg swelling, experience more shortness of breath or chest pain, call your Primary MD immediately. Follow Cardiac Low Salt Diet and 1.5 lit/day fluid restriction.  On your next visit with your primary care physician please Get Medicines reviewed and adjusted.  Please request your Prim.MD to go over all Hospital Tests and Procedure/Radiological results at the follow up, please get all Hospital records sent to your Prim MD by signing hospital release before you go home.  If you experience worsening of your admission symptoms, develop shortness of breath, life threatening emergency, suicidal or homicidal thoughts you must seek medical attention immediately by calling 911 or calling your MD immediately  if symptoms less severe.  You Must read complete instructions/literature along with all the possible adverse reactions/side effects for all the Medicines you take and that have been prescribed to you. Take any new Medicines after you have completely understood and accpet all the possible adverse reactions/side effects.   Do not drive, operate heavy machinery, perform activities at heights, swimming or participation in water activities or provide baby sitting services if your were admitted for syncope or siezures until you have seen by Primary MD or a Neurologist and advised to do so again.  Do not drive when taking Pain medications.    Do not take more than prescribed Pain, Sleep and Anxiety Medications  Special Instructions: If you have smoked or chewed Tobacco  in the last 2 yrs please stop smoking, stop any regular Alcohol  and or any Recreational drug  use.  Wear Seat belts while driving.   Please note  You were cared for by a hospitalist during your hospital stay. If you have any questions about your discharge medications or the care you received while you were in the hospital after you are discharged, you can call the unit and asked to speak with the hospitalist on call if the hospitalist that took care of you is not available. Once you are discharged, your primary care physician will handle any further medical issues. Please note that NO REFILLS for any discharge medications will be authorized once you are discharged, as it is imperative that you return to your primary care physician (or establish a relationship with a primary care physician if you do not have one) for your aftercare needs so that they can reassess your need for medications and monitor your lab values.   Anemia, Nonspecific Anemia is a condition in which the concentration of red blood cells or hemoglobin in the blood is below normal. Hemoglobin is a substance in red blood cells that carries oxygen to the tissues of the body. Anemia results in not enough oxygen reaching these tissues. What are the causes? Common causes of anemia include:  Excessive bleeding. Bleeding may be internal or external. This includes excessive bleeding from periods (in women) or from the intestine.  Poor nutrition.  Chronic kidney, thyroid, and liver disease.  Bone marrow disorders that decrease red blood cell production.  Cancer and treatments for cancer.  HIV, AIDS, and their treatments.  Spleen problems that increase red blood cell destruction.  Blood  disorders.  Excess destruction of red blood cells due to infection, medicines, and autoimmune disorders.  What are the signs or symptoms?  Minor weakness.  Dizziness.  Headache.  Palpitations.  Shortness of breath, especially with exercise.  Paleness.  Cold sensitivity.  Indigestion.  Nausea.  Difficulty  sleeping.  Difficulty concentrating. Symptoms may occur suddenly or they may develop slowly. How is this diagnosed? Additional blood tests are often needed. These help your health care provider determine the best treatment. Your health care provider will check your stool for blood and look for other causes of blood loss. How is this treated? Treatment varies depending on the cause of the anemia. Treatment can include:  Supplements of iron, vitamin B12, or folic acid.  Hormone medicines.  A blood transfusion. This may be needed if blood loss is severe.  Hospitalization. This may be needed if there is significant continual blood loss.  Dietary changes.  Spleen removal.  Follow these instructions at home: Keep all follow-up appointments. It often takes many weeks to correct anemia, and having your health care provider check on your condition and your response to treatment is very important. Get help right away if:  You develop extreme weakness, shortness of breath, or chest pain.  You become dizzy or have trouble concentrating.  You develop heavy vaginal bleeding.  You develop a rash.  You have bloody or black, tarry stools.  You faint.  You vomit up blood.  You vomit repeatedly.  You have abdominal pain.  You have a fever or persistent symptoms for more than 2-3 days.  You have a fever and your symptoms suddenly get worse.  You are dehydrated. This information is not intended to replace advice given to you by your health care provider. Make sure you discuss any questions you have with your health care provider. Document Released: 03/17/2004 Document Revised: 07/22/2015 Document Reviewed: 08/03/2012 Elsevier Interactive Patient Education  2017 [employer]    Blood Transfusion, Care After This sheet gives you information about how to care for yourself after your procedure. Your doctor may also give you more specific instructions. If you have problems or  questions, contact your doctor. Follow these instructions at home:  Take over-the-counter and prescription medicines only as told by your doctor.  Go back to your normal activities as told by your doctor.  Follow instructions from your doctor about how to take care of the area where an IV tube was put into your vein (insertion site). Make sure you: ? Wash your hands with soap and water before you change your bandage (dressing). If there is no soap and water, use hand sanitizer. ? Change your bandage as told by your doctor.  Check your IV insertion site every day for signs of infection. Check for: ? More redness, swelling, or pain. ? More fluid or blood. ? Warmth. ? Pus or a bad smell. Contact a doctor if:  You have more redness, swelling, or pain around the IV insertion site..  You have more fluid or blood coming from the IV insertion site.  Your IV insertion site feels warm to the touch.  You have pus or a bad smell coming from the IV insertion site.  Your pee (urine) turns pink, red, or brown.  You feel weak after doing your normal activities. Get help right away if:  You have signs of a serious allergic or body defense (immune) system reaction, including: ? Itchiness. ? Hives. ? Trouble breathing. ? Anxiety. ? Pain in your chest  or lower back. ? Fever, flushing, and chills. ? Fast pulse. ? Rash. ? Watery poop (diarrhea). ? Throwing up (vomiting). ? Dark pee. ? Serious headache. ? Dizziness. ? Stiff neck. ? Yellow color in your face or the white parts of your eyes (jaundice). Summary  After a blood transfusion, return to your normal activities as told by your doctor.  Every day, check for signs of infection where the IV tube was put into your vein.  Some signs of infection are warm skin, more redness and pain, more fluid or blood, and pus or a bad smell where the needle went in.  Contact your doctor if you feel weak or have any unusual symptoms. This  information is not intended to replace advice given to you by your health care provider. Make sure you discuss any questions you have with your health care provider. Document Released: 02/28/2014 Document Revised: 10/02/2015 Document Reviewed: 10/02/2015 Elsevier Interactive Patient Education  2017 [employer].

## 2017-01-17 NOTE — Discharge Summary (Signed)
Charles Franco Charles Franco:[id number] DOB: 04/22/1975 DOA: 01/13/2017  PCP: Patient, No Pcp Per  Admit date: 01/13/2017  Discharge date: 01/17/2017  Admitted From: Home   Disposition:  Home   Recommendations for Outpatient Follow-up:   Follow up with PCP in 1-2 weeks  PCP Please obtain BMP/CBC, 2 view CXR in 1week,  (see Discharge instructions)   PCP Please follow up on the following pending results: Monitor iron levels, CBC closely, kindly arrange for outpatient general surgery follow-up for hemorrhoidal banding.   Home Health: None Equipment/Devices: None  Consultations: GI Discharge Condition: Stable   CODE STATUS: Full   Diet Recommendation:  Heart Healthy    Chief Complaint  Patient presents with  . Dizziness  . Headache     Brief history of present illness from the day of admission and additional interim summary     Charles Franco  is a 41 y.o. male,.. With no significant medical problems [facility] chief complaint of dizziness.  Patient says that this is been going on for past 1 week.  Patient went to urgent care and was told that he has holosystolic murmur with wide pulse pressure and he was sent to ED to be seen by cardiologist.  Patient complains of dizziness and lightheadedness only happens when he gets from lying down to standing position.  He denies passing out.  Mild shortness of breath.  No chest pain.  Further workup showed that he had severe chronic blood loss related to iron deficiency anemia with a hemoglobin of 3 due to chronic hemorrhoidal bleed.                                                                 Hospital Course    Blood loss anemia, symptomatic anemia with a hemoglobin of close to 3 upon admission-   status post 4 units of packed RBC transfusion along  with IV Feraheme and oral iron supplementation, seen by GI, underwent colonoscopy showing both external and internal hemorrhoids, likely internal hemorrhoids were the source of bleeding, currently no signs of ongoing bleeding, H&H stable around 7 for the last 2 days, he will be discharged home on oral iron supplementation and stool softeners, request PCP to arrange for outpatient general surgery follow-up for eventual hemorrhoid banding.  Severe iron deficiency due to above: Transfused, received IV Feraheme, oral iron supplementation provided upon discharge, currently H&H stable, request PCP to monitor outpatient H&H and anemia panel.  Mild heart murmur Mitral Valve:  Flow murmur from severe anemia?  no chest pain, no sob, no syncope.  Stable echocardiogram.   H/o alcohol use, report drink 1-2 beer on weekend, stable.  Tobacco use; one pack a day, cessation education provided. He declined nicotine patch.     Discharge diagnosis  Active Problems:   Anemia   Iron deficiency anemia due to chronic blood loss   Hematochezia    Discharge instructions    Discharge Instructions    Diet - low sodium heart healthy   Complete by:  As directed    Discharge instructions   Complete by:  As directed    Follow with Primary MD in 7 days   Get CBC, CMP, anemia panel checked  by Primary MD in 5-7 days    Activity: As tolerated with Full fall precautions use walker/cane & assistance as needed  Disposition Home    Diet:  Heart Healthy   For Heart failure patients - Check your Weight same time everyday, if you gain over 2 pounds, or you develop in leg swelling, experience more shortness of breath or chest pain, call your Primary MD immediately. Follow Cardiac Low Salt Diet and 1.5 lit/day fluid restriction.  On your next visit with your primary care physician please Get Medicines reviewed and adjusted.  Please request your Prim.MD to go over all Hospital Tests and Procedure/Radiological  results at the follow up, please get all Hospital records sent to your Prim MD by signing hospital release before you go home.  If you experience worsening of your admission symptoms, develop shortness of breath, life threatening emergency, suicidal or homicidal thoughts you must seek medical attention immediately by calling 911 or calling your MD immediately  if symptoms less severe.  You Must read complete instructions/literature along with all the possible adverse reactions/side effects for all the Medicines you take and that have been prescribed to you. Take any new Medicines after you have completely understood and accpet all the possible adverse reactions/side effects.   Do not drive, operate heavy machinery, perform activities at heights, swimming or participation in water activities or provide baby sitting services if your were admitted for syncope or siezures until you have seen by Primary MD or a Neurologist and advised to do so again.  Do not drive when taking Pain medications.    Do not take more than prescribed Pain, Sleep and Anxiety Medications  Special Instructions: If you have smoked or chewed Tobacco  in the last 2 yrs please stop smoking, stop any regular Alcohol  and or any Recreational drug use.  Wear Seat belts while driving.   Please note  You were cared for by a hospitalist during your hospital stay. If you have any questions about your discharge medications or the care you received while you were in the hospital after you are discharged, you can call the unit and asked to speak with the hospitalist on call if the hospitalist that took care of you is not available. Once you are discharged, your primary care physician will handle any further medical issues. Please note that NO REFILLS for any discharge medications will be authorized once you are discharged, as it is imperative that you return to your primary care physician (or establish a relationship with a primary care  physician if you do not have one) for your aftercare needs so that they can reassess your need for medications and monitor your lab values.   Increase activity slowly   Complete by:  As directed       Discharge Medications   Allergies as of 01/17/2017   No Known Allergies     Medication List    TAKE these medications   acetaminophen 500 MG tablet Commonly known as:  TYLENOL Take 1,000 mg by mouth every 6 (six) hours  as needed for moderate pain, fever or headache.   docusate sodium 100 MG capsule Commonly known as:  COLACE Take 2 capsules (200 mg total) by mouth 2 (two) times daily.   ferrous sulfate 325 (65 FE) MG tablet Take 1 tablet (325 mg total) by mouth 2 (two) times daily with a meal.       Follow-up Information    Meryl Dare, MD. Schedule an appointment as soon as possible for a visit in 1 week(s).   Specialty:  Gastroenterology Contact information: [address] [place] [address] [phone]        [facility]. Schedule an appointment as soon as possible for a visit in 1 week(s).   Contact information: [address] [place] [address] [phone]       Kinsinger, De Blanch, MD. Schedule an appointment as soon as possible for a visit in 1 week(s).   Specialty:  General Surgery Why:  Hemorrhoids with bleeding Contact information: [address] [place] [address] [phone]           Major procedures and Radiology Reports - PLEASE review detailed and final reports thoroughly  -     Echocardiogram.    - Left ventricle: The cavity size was normal. There was mild concentric hypertrophy. Systolic function was normal. The estimated ejection fraction was in the range of 60% to 65%. Wall motion was normal; there were no regional wall motion abnormalities. Left ventricular diastolic function parameters   were normal. - Aortic valve: Transvalvular velocity was within the  normal range. There was no stenosis. There was no regurgitation. - Mitral valve: There was trivial regurgitation. - Left atrium: The atrium was mildly dilated. - Right ventricle: The cavity size was normal. Wall thickness was normal. Systolic function was normal. - Atrial septum: No defect or patent foramen ovale was identified by color flow Doppler. - Tricuspid valve: There was trivial regurgitation. - Pulmonary arteries: Systolic pressure was within the normal range. PA peak pressure: 35 mm Hg (S).   Colonoscopy.  Showing both external and internal hemorrhoids nonbleeding and nonthrombosed.  Done by Dr. Russella Dar on 01/16/2017.    Dg Chest 2 View  Result Date: 01/13/2017 CLINICAL DATA:  Chest pain, worse today. EXAM: CHEST  2 VIEW COMPARISON:  None. FINDINGS: Mild cardiomegaly for age. Normal mediastinal contours. No pulmonary edema. No consolidation, pleural fluid or pneumothorax. No acute osseous abnormality. IMPRESSION: Mild cardiomegaly. No congestive failure or acute pulmonary process. Electronically Signed   By: Rubye Oaks M.D.   On: 01/13/2017 19:12   Ct Head Wo Contrast  Result Date: 01/13/2017 CLINICAL DATA:  Headache and dizziness x1 week EXAM: CT HEAD WITHOUT CONTRAST TECHNIQUE: Contiguous axial images were obtained from the base of the skull through the vertex without intravenous contrast. COMPARISON:  None. FINDINGS: Brain: No evidence of acute infarction, hemorrhage, hydrocephalus, extra-axial collection or mass lesion/mass effect. Vascular: No hyperdense vessel or unexpected calcification. Skull: Normal. Negative for fracture or focal lesion. Sinuses/Orbits: No acute finding. Other: None IMPRESSION: No acute intracranial abnormality. Electronically Signed   By: Tollie Eth M.D.   On: 01/13/2017 19:14    Micro Results     No results found for this or any previous visit (from the past 240 hour(s)).  Today   Subjective    Charles Franco today has no headache,no chest  abdominal pain,no new weakness tingling or numbness, feels much better wants to go home today.  Objective   Blood pressure 125/61, pulse 68, temperature 98.8 F (37.1 C), temperature source Oral, resp. rate 17, height 6\' 1"  (1.854 m), weight 74.8 kg (165 lb), SpO2 100 %.   Intake/Output Summary (Last 24 hours) at 01/17/2017 1025 Last data filed at 01/17/2017 0600 Gross per 24 hour  Intake 240 ml  Output 0 ml  Net 240 ml    Exam Awake Alert, Oriented x 3, No new F.N deficits, Normal affect [place].AT,PERRAL Supple Neck,No JVD, No cervical lymphadenopathy appriciated.  Symmetrical Chest wall movement, Good air movement bilaterally, CTAB RRR,No Gallops,Rubs, soft mitral valve murmur, No Parasternal Heave +ve B.Sounds, Abd Soft, Non tender, No organomegaly appriciated, No rebound -guarding or rigidity. No Cyanosis, Clubbing or edema, No new Rash or bruise   Data Review   CBC w Diff:  Lab Results  Component Value Date   WBC 6.4 01/16/2017   HGB 7.3 (Charles) 01/16/2017   HCT 24.3 (Charles) 01/16/2017   PLT 261 01/16/2017   LYMPHOPCT 26 01/16/2017   MONOPCT 12 01/16/2017   EOSPCT 3 01/16/2017   BASOPCT 1 01/16/2017    CMP:  Lab Results  Component Value Date   NA 137 01/16/2017   K 4.0 01/16/2017   CL 108 01/16/2017   CO2 21 (Charles) 01/16/2017   BUN 6 01/16/2017   CREATININE 0.81 01/16/2017   PROT 6.7 01/16/2017   ALBUMIN 3.7 01/16/2017   BILITOT 1.0 01/16/2017   ALKPHOS 88 01/16/2017   AST 17 01/16/2017   ALT 14 (Charles) 01/16/2017  .   Total Time in preparing paper work, data evaluation and todays exam - 35 minutes  Susa RaringPrashant Singh M.D on 01/17/2017 at 10:25 AM  [facility]   Office  [phone]

## 2017-01-18 ENCOUNTER — Encounter (HOSPITAL_COMMUNITY): Payer: Self-pay | Admitting: Gastroenterology

## 2017-05-19 ENCOUNTER — Ambulatory Visit: Payer: Self-pay | Admitting: General Surgery

## 2017-05-19 MED ORDER — BUPIVACAINE LIPOSOME 1.3 % IJ SUSP: 20.0000 mL | INTRAMUSCULAR | Status: AC

## 2017-05-19 NOTE — H&P (Signed)
  History of Present Illness (Layann Bluett MD; 05/19/2017 3:08 PM) The patient is a 42 year old male who presents with a complaint of Rectal bleeding. 42 yo M with rectal bleeding since Nov 18. He was seen by Dr Stark and colonoscopy was performed. This showed grade 3 heomrrohids only. He denies any constipation and reports regular soft BM's. He has never had this before. He reports bright red blood and dark red blood in his stool.    Past Surgical History (Kelly Dockery, LPN; 05/19/2017 2:50 PM) No pertinent past surgical history  Diagnostic Studies History (Kelly Dockery, LPN; 05/19/2017 2:50 PM) Colonoscopy within last year  Allergies (Kelly Dockery, LPN; 05/19/2017 2:52 PM) No Known Allergies [05/19/2017]:  Medication History (Kelly Dockery, LPN; 05/19/2017 2:52 PM) Iron (65MG Tablet, Oral) Active. Stool Softener (100MG Tablet, Oral) Active. Medications Reconciled  Social History (Kelly Dockery, LPN; 05/19/2017 2:50 PM) Alcohol use Moderate alcohol use. Caffeine use Carbonated beverages, Coffee, Tea. No drug use Tobacco use Current every day smoker.  Family History (Kelly Dockery, LPN; 05/19/2017 2:50 PM) Hypertension Mother.  Other Problems (Kelly Dockery, LPN; 05/19/2017 2:50 PM) Back Pain Hemorrhoids Transfusion history     Review of Systems (Kelly Dockery LPN; 05/19/2017 2:50 PM) General Not Present- Appetite Loss, Chills, Fatigue, Fever, Night Sweats, Weight Gain and Weight Loss. Skin Not Present- Change in Wart/Mole, Dryness, Hives, Jaundice, New Lesions, Non-Healing Wounds, Rash and Ulcer. HEENT Not Present- Earache, Hearing Loss, Hoarseness, Nose Bleed, Oral Ulcers, Ringing in the Ears, Seasonal Allergies, Sinus Pain, Sore Throat, Visual Disturbances, Wears glasses/contact lenses and Yellow Eyes. Respiratory Not Present- Bloody sputum, Chronic Cough, Difficulty Breathing, Snoring and Wheezing. Cardiovascular Not Present- Chest Pain, Difficulty  Breathing Lying Down, Leg Cramps, Palpitations, Rapid Heart Rate, Shortness of Breath and Swelling of Extremities. Gastrointestinal Present- Bloody Stool and Hemorrhoids. Not Present- Abdominal Pain, Bloating, Change in Bowel Habits, Chronic diarrhea, Constipation, Difficulty Swallowing, Excessive gas, Gets full quickly at meals, Indigestion, Nausea, Rectal Pain and Vomiting. Musculoskeletal Present- Back Pain. Not Present- Joint Pain, Joint Stiffness, Muscle Pain, Muscle Weakness and Swelling of Extremities. Neurological Not Present- Decreased Memory, Fainting, Headaches, Numbness, Seizures, Tingling, Tremor, Trouble walking and Weakness. Psychiatric Not Present- Anxiety, Bipolar, Change in Sleep Pattern, Depression, Fearful and Frequent crying. Endocrine Not Present- Cold Intolerance, Excessive Hunger, Hair Changes, Heat Intolerance, Hot flashes and New Diabetes. Hematology Not Present- Blood Thinners, Easy Bruising, Excessive bleeding, Gland problems, HIV and Persistent Infections.  Vitals (Kelly Dockery LPN; 05/19/2017 2:52 PM) 05/19/2017 2:51 PM Weight: 98.9 lb Height: 72in Body Surface Area: 1.58 m Body Mass Index: 13.41 kg/m  Temp.: 98.9F(Oral)  Pulse: 88 (Regular)  BP: 120/62 (Sitting, Right Arm, Standard)      Physical Exam (Sinda Leedom MD; 05/19/2017 3:16 PM)  General Mental Status-Alert. General Appearance-Not in acute distress. Build & Nutrition-Well nourished. Posture-Normal posture. Gait-Normal.  Head and Neck Head-normocephalic, atraumatic with no lesions or palpable masses. Trachea-midline.  Chest and Lung Exam Chest and lung exam reveals -on auscultation, normal breath sounds, no adventitious sounds and normal vocal resonance.  Cardiovascular Cardiovascular examination reveals -normal heart sounds, regular rate and rhythm with no murmurs and no digital clubbing, cyanosis, edema, increased warmth or  tenderness.  Abdomen Inspection Inspection of the abdomen reveals - No Hernias. Palpation/Percussion Palpation and Percussion of the abdomen reveal - Soft, Non Tender, No Rigidity (guarding), No hepatosplenomegaly and No Palpable abdominal masses.  Rectal Proctoscopic exam-Bloody and Internal hemorrhoids(grade 3).  Neurologic Neurologic evaluation reveals -alert and oriented x 3 with   no impairment of recent or remote memory, normal attention span and ability to concentrate, normal sensation and normal coordination.  Musculoskeletal Normal Exam - Bilateral-Upper Extremity Strength Normal and Lower Extremity Strength Normal.    Assessment & Plan (Hue Frick MD; 05/19/2017 3:14 PM)  RECTAL BLEEDING (K62.5) Impression: 42 yo M with rectal bleeding for 5 months. No FH of colon cancer. He does have some actively bleeding hemorrhoids on anoscopy. Colonscopy shows grade 3 hemorrhoids. We discussed surgical options, since he has failed non-surgical options. We discussed THD vs hemorrhoidectomy. He has decided to proceed with hemorrhoidectomy. He is aware that this will be painful for several weeks. Other risks inlcude bleeding, scarring, acute ruinary retention and recurrence. I believe he understands this and agrees to proceed. 

## 2017-05-19 NOTE — H&P (View-Only) (Signed)
History of Present Illness Charles Franco(Azalynn Maxim MD; 05/19/2017 3:08 PM) The patient is a 42 year old male who presents with a complaint of Rectal bleeding. 42 yo M with rectal bleeding since Nov 18. He was seen by Dr Russella DarStark and colonoscopy was performed. This showed grade 3 heomrrohids only. He denies any constipation and reports regular soft BM's. He has never had this before. He reports bright red blood and dark red blood in his stool.    Past Surgical History Charles Franco(Kelly Dockery, LPN; 1/61/09603/29/2019 4:542:50 PM) No pertinent past surgical history  Diagnostic Studies History Charles Franco(Kelly Dockery, LPN; 0/98/11913/29/2019 4:782:50 PM) Colonoscopy within last year  Allergies Charles Franco(Kelly Dockery, LPN; 2/95/62133/29/2019 0:862:52 PM) No Known Allergies [05/19/2017]:  Medication History Charles Franco(Kelly Dockery, LPN; 5/78/46963/29/2019 2:952:52 PM) Iron (65MG  Tablet, Oral) Active. Stool Softener (100MG  Tablet, Oral) Active. Medications Reconciled  Social History Charles Franco(Kelly Dockery, LPN; 2/84/13243/29/2019 4:012:50 PM) Alcohol use Moderate alcohol use. Caffeine use Carbonated beverages, Coffee, Tea. No drug use Tobacco use Current every day smoker.  Family History Charles Franco(Kelly Dockery, LPN; 0/27/25363/29/2019 6:442:50 PM) Hypertension Mother.  Other Problems Charles Franco(Kelly Dockery, LPN; 0/34/74253/29/2019 9:562:50 PM) Back Pain Hemorrhoids Transfusion history     Review of Systems Tresa Endo(Kelly Dockery LPN; 3/87/56433/29/2019 3:292:50 PM) General Not Present- Appetite Loss, Chills, Fatigue, Fever, Night Sweats, Weight Gain and Weight Loss. Skin Not Present- Change in Wart/Mole, Dryness, Hives, Jaundice, New Lesions, Non-Healing Wounds, Rash and Ulcer. HEENT Not Present- Earache, Hearing Loss, Hoarseness, Nose Bleed, Oral Ulcers, Ringing in the Ears, Seasonal Allergies, Sinus Pain, Sore Throat, Visual Disturbances, Wears glasses/contact lenses and Yellow Eyes. Respiratory Not Present- Bloody sputum, Chronic Cough, Difficulty Breathing, Snoring and Wheezing. Cardiovascular Not Present- Chest Pain, Difficulty  Breathing Lying Down, Leg Cramps, Palpitations, Rapid Heart Rate, Shortness of Breath and Swelling of Extremities. Gastrointestinal Present- Bloody Stool and Hemorrhoids. Not Present- Abdominal Pain, Bloating, Change in Bowel Habits, Chronic diarrhea, Constipation, Difficulty Swallowing, Excessive gas, Gets full quickly at meals, Indigestion, Nausea, Rectal Pain and Vomiting. Musculoskeletal Present- Back Pain. Not Present- Joint Pain, Joint Stiffness, Muscle Pain, Muscle Weakness and Swelling of Extremities. Neurological Not Present- Decreased Memory, Fainting, Headaches, Numbness, Seizures, Tingling, Tremor, Trouble walking and Weakness. Psychiatric Not Present- Anxiety, Bipolar, Change in Sleep Pattern, Depression, Fearful and Frequent crying. Endocrine Not Present- Cold Intolerance, Excessive Hunger, Hair Changes, Heat Intolerance, Hot flashes and New Diabetes. Hematology Not Present- Blood Thinners, Easy Bruising, Excessive bleeding, Gland problems, HIV and Persistent Infections.  Vitals Tresa Endo(Kelly Dockery LPN; 5/18/84163/29/2019 6:062:52 PM) 05/19/2017 2:51 PM Weight: 98.9 lb Height: 72in Body Surface Area: 1.58 m Body Mass Index: 13.41 kg/m  Temp.: 98.41F(Oral)  Pulse: 88 (Regular)  BP: 120/62 (Sitting, Right Arm, Standard)      Physical Exam Charles Franco(Madolyn Ackroyd MD; 05/19/2017 3:16 PM)  General Mental Status-Alert. General Appearance-Not in acute distress. Build & Nutrition-Well nourished. Posture-Normal posture. Gait-Normal.  Head and Neck Head-normocephalic, atraumatic with no lesions or palpable masses. Trachea-midline.  Chest and Lung Exam Chest and lung exam reveals -on auscultation, normal breath sounds, no adventitious sounds and normal vocal resonance.  Cardiovascular Cardiovascular examination reveals -normal heart sounds, regular rate and rhythm with no murmurs and no digital clubbing, cyanosis, edema, increased warmth or  tenderness.  Abdomen Inspection Inspection of the abdomen reveals - No Hernias. Palpation/Percussion Palpation and Percussion of the abdomen reveal - Soft, Non Tender, No Rigidity (guarding), No hepatosplenomegaly and No Palpable abdominal masses.  Rectal Proctoscopic exam-Bloody and Internal hemorrhoids(grade 3).  Neurologic Neurologic evaluation reveals -alert and oriented x 3 with  no impairment of recent or remote memory, normal attention span and ability to concentrate, normal sensation and normal coordination.  Musculoskeletal Normal Exam - Bilateral-Upper Extremity Strength Normal and Lower Extremity Strength Normal.    Assessment & Plan Charles Levee MD; 05/19/2017 3:14 PM)  RECTAL BLEEDING (K62.5) Impression: 42 yo M with rectal bleeding for 5 months. No FH of colon cancer. He does have some actively bleeding hemorrhoids on anoscopy. Colonscopy shows grade 3 hemorrhoids. We discussed surgical options, since he has failed non-surgical options. We discussed THD vs hemorrhoidectomy. He has decided to proceed with hemorrhoidectomy. He is aware that this will be painful for several weeks. Other risks inlcude bleeding, scarring, acute ruinary retention and recurrence. I believe he understands this and agrees to proceed.

## 2017-05-30 ENCOUNTER — Other Ambulatory Visit: Payer: Self-pay

## 2017-05-30 ENCOUNTER — Encounter (HOSPITAL_BASED_OUTPATIENT_CLINIC_OR_DEPARTMENT_OTHER): Payer: Self-pay

## 2017-05-30 NOTE — Progress Notes (Signed)
Instructed to not smoke after midnight prior to surgery.

## 2017-05-30 NOTE — Progress Notes (Signed)
Spoke with:  Jomarie LongsJoseph NPO:  After Midnight, no gum, candy, or mints   Arrival time: 0745AM Labs: None AM medications:  None Pre op orders: yes Ride home:  Mliss FritzKaricka Derderian (sister) [phone]

## 2017-06-08 ENCOUNTER — Ambulatory Visit (HOSPITAL_BASED_OUTPATIENT_CLINIC_OR_DEPARTMENT_OTHER)
Admission: RE | Admit: 2017-06-08 | Discharge: 2017-06-08 | Disposition: A | Discharge status: Home / Self Care | Source: Ambulatory Visit | Attending: General Surgery | Admitting: General Surgery

## 2017-06-08 ENCOUNTER — Encounter (HOSPITAL_BASED_OUTPATIENT_CLINIC_OR_DEPARTMENT_OTHER)
Admission: RE | Disposition: A | Discharge status: Home / Self Care | Payer: Self-pay | Source: Ambulatory Visit | Attending: General Surgery

## 2017-06-08 ENCOUNTER — Ambulatory Visit (HOSPITAL_BASED_OUTPATIENT_CLINIC_OR_DEPARTMENT_OTHER): Admitting: Anesthesiology

## 2017-06-08 ENCOUNTER — Encounter (HOSPITAL_BASED_OUTPATIENT_CLINIC_OR_DEPARTMENT_OTHER): Payer: Self-pay

## 2017-06-08 DIAGNOSIS — K601 Chronic anal fissure: Secondary | ICD-10-CM | POA: Diagnosis not present

## 2017-06-08 DIAGNOSIS — F17200 Nicotine dependence, unspecified, uncomplicated: Secondary | ICD-10-CM | POA: Insufficient documentation

## 2017-06-08 DIAGNOSIS — K642 Third degree hemorrhoids: Secondary | ICD-10-CM | POA: Diagnosis not present

## 2017-06-08 HISTORY — DX: Personal history of other diseases of the digestive system: Z87.19

## 2017-06-08 HISTORY — DX: Third degree hemorrhoids: K64.2

## 2017-06-08 HISTORY — DX: Personal history of other medical treatment: Z92.89

## 2017-06-08 HISTORY — DX: Personal history of colonic polyps: Z86.010

## 2017-06-08 HISTORY — DX: Cardiac murmur, unspecified: R01.1

## 2017-06-08 HISTORY — DX: Iron deficiency anemia, unspecified: D50.9

## 2017-06-08 HISTORY — DX: Headache: R51

## 2017-06-08 HISTORY — DX: Chest pain, unspecified: R07.9

## 2017-06-08 HISTORY — DX: Residual hemorrhoidal skin tags: K64.4

## 2017-06-08 HISTORY — PX: HEMORRHOID SURGERY: SHX153

## 2017-06-08 HISTORY — DX: Headache, unspecified: R51.9

## 2017-06-08 SURGERY — HEMORRHOIDECTOMY
Anesthesia: Monitor Anesthesia Care | Site: Rectum

## 2017-06-08 MED ORDER — SODIUM CHLORIDE 0.9 % IV SOLN
INTRAVENOUS | Status: DC | PRN
  Administered 2017-06-08: 20 ug/kg/min via INTRAVENOUS

## 2017-06-08 MED ORDER — ACETAMINOPHEN 500 MG PO TABS
ORAL_TABLET | ORAL | Status: AC
  Filled 2017-06-08: qty 2

## 2017-06-08 MED ORDER — CELECOXIB 200 MG PO CAPS
200.0000 mg | ORAL_CAPSULE | ORAL | Status: AC
  Administered 2017-06-08: 200 mg via ORAL
  Filled 2017-06-08: qty 1

## 2017-06-08 MED ORDER — FENTANYL CITRATE (PF) 100 MCG/2ML IJ SOLN
INTRAMUSCULAR | Status: AC
  Filled 2017-06-08: qty 2

## 2017-06-08 MED ORDER — ACETAMINOPHEN 325 MG PO TABS
650.0000 mg | ORAL_TABLET | ORAL | Status: DC | PRN
  Filled 2017-06-08: qty 2

## 2017-06-08 MED ORDER — ONDANSETRON HCL 4 MG/2ML IJ SOLN
INTRAMUSCULAR | Status: DC | PRN
  Administered 2017-06-08: 4 mg via INTRAVENOUS

## 2017-06-08 MED ORDER — MIDAZOLAM HCL 2 MG/2ML IJ SOLN
INTRAMUSCULAR | Status: AC
  Filled 2017-06-08: qty 2

## 2017-06-08 MED ORDER — SODIUM CHLORIDE 0.9 % IJ SOLN
INTRAMUSCULAR | Status: AC
  Filled 2017-06-08: qty 100

## 2017-06-08 MED ORDER — PROPOFOL 500 MG/50ML IV EMUL
INTRAVENOUS | Status: DC | PRN
  Administered 2017-06-08: 200 ug/kg/min via INTRAVENOUS

## 2017-06-08 MED ORDER — LIDOCAINE 2% (20 MG/ML) 5 ML SYRINGE
INTRAMUSCULAR | Status: AC
  Filled 2017-06-08: qty 5

## 2017-06-08 MED ORDER — OXYCODONE HCL 5 MG PO TABS
5.0000 mg | ORAL_TABLET | Freq: Once | ORAL | Status: DC | PRN
  Filled 2017-06-08: qty 1

## 2017-06-08 MED ORDER — CELECOXIB 200 MG PO CAPS
ORAL_CAPSULE | ORAL | Status: AC
  Filled 2017-06-08: qty 1

## 2017-06-08 MED ORDER — LIDOCAINE 2% (20 MG/ML) 5 ML SYRINGE
INTRAMUSCULAR | Status: DC | PRN
  Administered 2017-06-08: 25 mg via INTRAVENOUS

## 2017-06-08 MED ORDER — PROMETHAZINE HCL 25 MG/ML IJ SOLN
6.2500 mg | INTRAMUSCULAR | Status: DC | PRN
  Filled 2017-06-08: qty 1

## 2017-06-08 MED ORDER — SODIUM CHLORIDE 0.9 % IV SOLN
250.0000 mL | INTRAVENOUS | Status: DC | PRN
  Filled 2017-06-08: qty 250

## 2017-06-08 MED ORDER — ONABOTULINUMTOXINA 100 UNITS IJ SOLR
INTRAMUSCULAR | Status: AC
  Filled 2017-06-08: qty 100

## 2017-06-08 MED ORDER — OXYCODONE HCL 5 MG PO TABS
5.0000 mg | ORAL_TABLET | ORAL | Status: DC | PRN
  Filled 2017-06-08: qty 2

## 2017-06-08 MED ORDER — LIDOCAINE 5 % EX OINT
TOPICAL_OINTMENT | CUTANEOUS | Status: DC | PRN
  Administered 2017-06-08: 1

## 2017-06-08 MED ORDER — OXYCODONE HCL 5 MG/5ML PO SOLN
5.0000 mg | Freq: Once | ORAL | Status: DC | PRN
  Filled 2017-06-08: qty 5

## 2017-06-08 MED ORDER — FENTANYL CITRATE (PF) 100 MCG/2ML IJ SOLN
25.0000 ug | INTRAMUSCULAR | Status: DC | PRN
  Filled 2017-06-08: qty 1

## 2017-06-08 MED ORDER — BUPIVACAINE LIPOSOME 1.3 % IJ SUSP
INTRAMUSCULAR | Status: DC | PRN
  Administered 2017-06-08: 20 mL

## 2017-06-08 MED ORDER — MIDAZOLAM HCL 2 MG/2ML IJ SOLN
INTRAMUSCULAR | Status: DC | PRN
Start: 2017-06-08 — End: 2017-06-08
  Administered 2017-06-08: 2 mg via INTRAVENOUS

## 2017-06-08 MED ORDER — GABAPENTIN 300 MG PO CAPS
300.0000 mg | ORAL_CAPSULE | ORAL | Status: AC
  Administered 2017-06-08: 300 mg via ORAL
  Filled 2017-06-08: qty 1

## 2017-06-08 MED ORDER — KETAMINE HCL 100 MG/ML IJ SOLN: INTRAMUSCULAR | Status: DC | PRN

## 2017-06-08 MED ORDER — PROPOFOL 500 MG/50ML IV EMUL
INTRAVENOUS | Status: AC
  Filled 2017-06-08: qty 50

## 2017-06-08 MED ORDER — BUPIVACAINE-EPINEPHRINE 0.5% -1:200000 IJ SOLN
INTRAMUSCULAR | Status: DC | PRN
  Administered 2017-06-08: 30 mL

## 2017-06-08 MED ORDER — SODIUM CHLORIDE 0.9% FLUSH
3.0000 mL | Freq: Two times a day (BID) | INTRAVENOUS | Status: DC
  Filled 2017-06-08: qty 3

## 2017-06-08 MED ORDER — DIAZEPAM 5 MG PO TABS: 5.0000 mg | ORAL_TABLET | Freq: Four times a day (QID) | ORAL | 0 refills | Status: AC | PRN

## 2017-06-08 MED ORDER — ONABOTULINUMTOXINA 100 UNITS IJ SOLR
INTRAMUSCULAR | Status: DC | PRN
  Administered 2017-06-08: 50 [IU] via INTRAMUSCULAR

## 2017-06-08 MED ORDER — ACETAMINOPHEN 650 MG RE SUPP
650.0000 mg | RECTAL | Status: DC | PRN
  Filled 2017-06-08: qty 1

## 2017-06-08 MED ORDER — SODIUM CHLORIDE 0.9% FLUSH
3.0000 mL | INTRAVENOUS | Status: DC | PRN
Start: 2017-06-08 — End: 2017-06-08
  Filled 2017-06-08: qty 3

## 2017-06-08 MED ORDER — LACTATED RINGERS IV SOLN
INTRAVENOUS | Status: DC
  Administered 2017-06-08 (×2): via INTRAVENOUS
  Filled 2017-06-08: qty 1000

## 2017-06-08 MED ORDER — DEXAMETHASONE SODIUM PHOSPHATE 10 MG/ML IJ SOLN
INTRAMUSCULAR | Status: AC
  Filled 2017-06-08: qty 1

## 2017-06-08 MED ORDER — GABAPENTIN 300 MG PO CAPS
ORAL_CAPSULE | ORAL | Status: AC
  Filled 2017-06-08: qty 1

## 2017-06-08 MED ORDER — ONDANSETRON HCL 4 MG/2ML IJ SOLN
INTRAMUSCULAR | Status: AC
  Filled 2017-06-08: qty 2

## 2017-06-08 MED ORDER — OXYCODONE HCL 5 MG PO TABS: 5.0000 mg | ORAL_TABLET | Freq: Four times a day (QID) | ORAL | 0 refills | Status: AC | PRN

## 2017-06-08 MED ORDER — ACETAMINOPHEN 500 MG PO TABS
1000.0000 mg | ORAL_TABLET | ORAL | Status: AC
  Administered 2017-06-08: 1000 mg via ORAL
  Filled 2017-06-08: qty 2

## 2017-06-08 MED ORDER — DEXAMETHASONE SODIUM PHOSPHATE 10 MG/ML IJ SOLN
INTRAMUSCULAR | Status: DC | PRN
  Administered 2017-06-08: 10 mg via INTRAVENOUS

## 2017-06-08 MED ORDER — KETAMINE HCL 10 MG/ML IJ SOLN
INTRAMUSCULAR | Status: AC
  Filled 2017-06-08: qty 1

## 2017-06-08 SURGICAL SUPPLY — 38 items
BLADE EXTENDED COATED 6.5IN (ELECTRODE) IMPLANT
BLADE HEX COATED 2.75 (ELECTRODE) ×2 IMPLANT
BLADE SURG 10 STRL SS (BLADE) IMPLANT
BLADE SURG 15 STRL LF DISP TIS (BLADE) ×1 IMPLANT
BLADE SURG 15 STRL SS (BLADE) ×1
BRIEF STRETCH FOR OB PAD LRG (UNDERPADS AND DIAPERS) ×2 IMPLANT
COVER BACK TABLE 60X90IN (DRAPES) ×2 IMPLANT
COVER MAYO STAND STRL (DRAPES) ×2 IMPLANT
DRAPE LAPAROTOMY 100X72 PEDS (DRAPES) ×2 IMPLANT
DRAPE UTILITY XL STRL (DRAPES) ×2 IMPLANT
ELECT REM PT RETURN 9FT ADLT (ELECTROSURGICAL) ×2
ELECTRODE REM PT RTRN 9FT ADLT (ELECTROSURGICAL) ×1 IMPLANT
GAUZE SPONGE 4X4 12PLY STRL (GAUZE/BANDAGES/DRESSINGS) ×4 IMPLANT
GAUZE SPONGE 4X4 16PLY XRAY LF (GAUZE/BANDAGES/DRESSINGS) IMPLANT
GLOVE BIO SURGEON STRL SZ 6.5 (GLOVE) ×2 IMPLANT
GLOVE INDICATOR 7.0 STRL GRN (GLOVE) ×2 IMPLANT
GOWN STRL REUS W/TWL 2XL LVL3 (GOWN DISPOSABLE) ×2 IMPLANT
KIT TURNOVER CYSTO (KITS) ×2 IMPLANT
NEEDLE HYPO 22GX1.5 SAFETY (NEEDLE) ×2 IMPLANT
NS IRRIG 500ML POUR BTL (IV SOLUTION) ×2 IMPLANT
PACK BASIN DAY SURGERY FS (CUSTOM PROCEDURE TRAY) ×2 IMPLANT
PAD ABD 8X10 STRL (GAUZE/BANDAGES/DRESSINGS) ×4 IMPLANT
PAD ARMBOARD 7.5X6 YLW CONV (MISCELLANEOUS) IMPLANT
PENCIL BUTTON HOLSTER BLD 10FT (ELECTRODE) ×2 IMPLANT
SPONGE SURGIFOAM ABS GEL 100 (HEMOSTASIS) IMPLANT
SPONGE SURGIFOAM ABS GEL 12-7 (HEMOSTASIS) IMPLANT
SUT CHROMIC 2 0 SH (SUTURE) ×8 IMPLANT
SUT CHROMIC 3 0 SH 27 (SUTURE) ×6 IMPLANT
SUT VIC AB 2-0 SH 27 (SUTURE)
SUT VIC AB 2-0 SH 27XBRD (SUTURE) IMPLANT
SUT VIC AB 4-0 P-3 18XBRD (SUTURE) IMPLANT
SUT VIC AB 4-0 P3 18 (SUTURE)
SUT VIC AB 4-0 SH 18 (SUTURE) IMPLANT
SYR CONTROL 10ML LL (SYRINGE) ×2 IMPLANT
TRAY DSU PREP LF (CUSTOM PROCEDURE TRAY) ×2 IMPLANT
TUBE CONNECTING 12X1/4 (SUCTIONS) ×2 IMPLANT
WATER STERILE IRR 500ML POUR (IV SOLUTION) IMPLANT
YANKAUER SUCT BULB TIP NO VENT (SUCTIONS) ×2 IMPLANT

## 2017-06-08 NOTE — Anesthesia Postprocedure Evaluation (Signed)
Anesthesia Post Note  Patient: DOREN KASPAR  Procedure(s) Performed: HEMORRHOIDECTOMY x3, SPHINCTEROTOMY AND BOTOX INJECTION (N/A Rectum)     Patient location during evaluation: PACU Anesthesia Type: MAC Level of consciousness: awake and alert Pain management: pain level controlled Vital Signs Assessment: post-procedure vital signs reviewed and stable Respiratory status: spontaneous breathing, nonlabored ventilation, respiratory function stable and patient connected to nasal cannula oxygen Cardiovascular status: stable and blood pressure returned to baseline Postop Assessment: no apparent nausea or vomiting Anesthetic complications: no    Last Vitals:  Vitals:   06/08/17 0755 06/08/17 1033  BP: 131/69 133/65  Pulse: 64 76  Resp: 16 12  Temp: 36.5 C 36.5 C  SpO2: 100% 100%    Last Pain:  Vitals:   06/08/17 1045  TempSrc:   PainSc: Asleep                 Yazmin Locher S

## 2017-06-08 NOTE — Interval H&P Note (Signed)
History and Physical Interval Note:  06/08/2017 8:00 AM  Charles Franco  has presented today for surgery, with the diagnosis of RECTAL BLEEDING, GRADE 3 HEMORRHOIDS  The various methods of treatment have been discussed with the patient and family. After consideration of risks, benefits and other options for treatment, the patient has consented to  Procedure(s): HEMORRHOIDECTOMY (N/A) as a surgical intervention .  The patient's history has been reviewed, patient examined, no change in status, stable for surgery.  I have reviewed the patient's chart and labs.  Questions were answered to the patient's satisfaction.     Vanita PandaAlicia C Yaw Escoto, MD  Colorectal and General Surgery [facility]

## 2017-06-08 NOTE — Transfer of Care (Signed)
Immediate Anesthesia Transfer of Care Note  Patient: Charles Franco  Procedure(s) Performed: Procedure(s) (LRB): HEMORRHOIDECTOMY x3, SPHINCTEROTOMY AND BOTOX INJECTION (N/A)  Patient Location: PACU  Anesthesia Type: MAC  Level of Consciousness: awake, alert , oriented and patient cooperative  Airway & Oxygen Therapy: Patient Spontanous Breathing and Patient connected to face mask oxygen  Post-op Assessment: Report given to PACU RN and Post -op Vital signs reviewed and stable  Post vital signs: Reviewed and stable  Complications: No apparent anesthesia complications Last Vitals:  Vitals Value Taken Time  BP    Temp    Pulse    Resp    SpO2      Last Pain:  Vitals:   06/08/17 0807  TempSrc:   PainSc: 0-No pain      Patients Stated Pain Goal: 6 (06/08/17 0807)

## 2017-06-08 NOTE — Anesthesia Procedure Notes (Signed)
Procedure Name: MAC Date/Time: 06/08/2017 9:37 AM Performed by: Wanita Chamberlain, CRNA Pre-anesthesia Checklist: Patient identified, Timeout performed, Emergency Drugs available, Suction available and Patient being monitored Patient Re-evaluated:Patient Re-evaluated prior to induction Oxygen Delivery Method: Nasal cannula Induction Type: IV induction Placement Confirmation: breath sounds checked- equal and bilateral,  CO2 detector and positive ETCO2 Dental Injury: Teeth and Oropharynx as per pre-operative assessment

## 2017-06-08 NOTE — Discharge Instructions (Addendum)
ANORECTAL SURGERY: POST OP INSTRUCTIONS °1. Take your usually prescribed home medications unless otherwise directed. °2. DIET: During the first few hours after surgery sip on some liquids until you are able to urinate.  It is normal to not urinate for several hours after this surgery.  If you feel uncomfortable, please contact the office for instructions.  After you are able to urinate,you may eat, if you feel like it.  Follow a light bland diet the first 24 hours after arrival home, such as soup, liquids, crackers, etc.  Be sure to include lots of fluids daily (6-8 glasses).  Avoid fast food or heavy meals, as your are more likely to get nauseated.  Eat a low fat diet the next few days after surgery.  Limit caffeine intake to 1-2 servings a day. °3. PAIN CONTROL: °a. Pain is best controlled by a usual combination of several different methods TOGETHER: °i. Muscle relaxation °1.  Soak in a warm bath (or Sitz bath) three times a day and after bowel movements.  Continue to do this until all pain is resolved. °2. Take the muscle relaxer (Valium) every 6 hours for the first 2 days after surgery  °ii. Over the counter pain medication °iii. Prescription pain medication °b. Most patients will experience some swelling and discomfort in the anus/rectal area and incisions.  Heat such as warm towels, sitz baths, warm baths, etc to help relax tight/sore spots and speed recovery.  Some people prefer to use ice, especially in the first couple days after surgery, as it may decrease the pain and swelling, or alternate between ice & heat.  Experiment to what works for you.  Swelling and bruising can take several weeks to resolve.  Pain can take even longer to completely resolve. °c. It is helpful to take an over-the-counter pain medication regularly for the first few weeks.  Choose one of the following that works best for you: °i. Naproxen (Aleve, etc)  Two 220mg tabs twice a day °ii. Ibuprofen (Advil, etc) Three 200mg tabs four  times a day (every meal & bedtime) °d. A  prescription for pain medication (such as percocet, oxycodone, hydrocodone, etc) should be given to you upon discharge.  Take your pain medication as prescribed.  °i. If you are having problems/concerns with the prescription medicine (does not control pain, nausea, vomiting, rash, itching, etc), please call us [phone] to see if we need to switch you to a different pain medicine that will work better for you and/or control your side effect better. °ii. If you need a refill on your pain medication, please contact your pharmacy.  They will contact our office to request authorization. Prescriptions will not be filled after 5 pm or on week-ends. °4. KEEP YOUR BOWELS REGULAR and AVOID CONSTIPATION °a. The goal is one to two soft bowel movements a day.  You should at least have a bowel movement every other day. °b. Avoid getting constipated.  Between the surgery and the pain medications, it is common to experience some constipation. This can be very painful after rectal surgery.  Increasing fluid intake and taking a fiber supplement (such as Metamucil, Citrucel, FiberCon, etc) 1-2 times a day regularly will usually help prevent this problem from occurring.  A stool softener like colace is also recommended.  This can be purchased over the counter at your pharmacy.  You can take it up to 3 times a day.  If you do not have a bowel movement after 24 hrs since your surgery,   take one does of milk of magnesia.  If you still haven't had a bowel movement 8-12 hours after that dose, take another dose.  If you don't have a bowel movement 48 hrs after surgery, purchase a Fleets enema from the drug store and administer gently per package instructions.  If you still are having trouble with your bowel movements after that, please call the office for further instructions. °c. If you develop diarrhea or have many loose bowel movements, simplify your diet to bland foods & liquids for a few  days.  Stop any stool softeners and decrease your fiber supplement.  Switching to mild anti-diarrheal medications (Kayopectate, Pepto Bismol) can help.  If this worsens or does not improve, please call us. ° °5. Wound Care °a. Remove your bandages before your first bowel movement or 8 hours after surgery.     °b. Remove any wound packing material at this tim,e as well.  You do not need to repack the wound unless instructed otherwise.  Wear an absorbent pad or soft cotton gauze in your underwear to catch any drainage and help keep the area clean. You should change this every 2-3 hours while awake. °c. Keep the area clean and dry.  Bathe / shower every day, especially after bowel movements.  Keep the area clean by showering / bathing over the incision / wound.   It is okay to soak an open wound to help wash it.  Wet wipes or showers / gentle washing after bowel movements is often less traumatic than regular toilet paper. °d. You may have some styrofoam-like soft packing in the rectum which will come out with the first bowel movement.  °e. You will often notice bleeding with bowel movements.  This should slow down by the end of the first week of surgery °f. Expect some drainage.  This should slow down, too, by the end of the first week of surgery.  Wear an absorbent pad or soft cotton gauze in your underwear until the drainage stops. °g. Do Not sit on a rubber or pillow ring.  This can make you symptoms worse.  You may sit on a soft pillow if needed.  °6. ACTIVITIES as tolerated:   °a. You may resume regular (light) daily activities beginning the next day--such as daily self-care, walking, climbing stairs--gradually increasing activities as tolerated.  If you can walk 30 minutes without difficulty, it is safe to try more intense activity such as jogging, treadmill, bicycling, low-impact aerobics, swimming, etc. °b. Save the most intensive and strenuous activity for last such as sit-ups, heavy lifting, contact sports,  etc  Refrain from any heavy lifting or straining until you are off narcotics for pain control.   °c. You may drive when you are no longer taking prescription pain medication, you can comfortably sit for long periods of time, and you can safely maneuver your car and apply brakes. °d. You may have sexual intercourse when it is comfortable.  °7. FOLLOW UP in our office °a. Please call [facility] at [phone] to set up an appointment to see your surgeon in the office for a follow-up appointment approximately 3-4 weeks after your surgery. °b. Make sure that you call for this appointment the day you arrive home to insure a convenient appointment time. °10. IF YOU HAVE DISABILITY OR FAMILY LEAVE FORMS, BRING THEM TO THE OFFICE FOR PROCESSING.  DO NOT GIVE THEM TO YOUR DOCTOR. ° ° ° ° °WHEN TO CALL US [phone]: °1. Poor pain control °  2. Reactions / problems with new medications (rash/itching, nausea, etc)  3. Fever over 101.5 F (38.5 C) 4. Inability to urinate 5. Nausea and/or vomiting 6. Worsening swelling or bruising 7. Continued bleeding from incision. 8. Increased pain, redness, or drainage from the incision  The clinic staff is available to answer your questions during regular business hours (8:30am-5pm).  Please dont hesitate to call and ask to speak to one of our nurses for clinical concerns.   A surgeon from [facility] is always on call at the hospitals   If you have a medical emergency, go to the nearest emergency room or call 911.    [facility] [address], [address], [place], [place]  [address] ? MAIN: [phone] ? TOLL FREE: [phone] ? FAX [phone] [email or web address]   Post Anesthesia Home Care Instructions  Activity: Get plenty of rest for the remainder of the day. A responsible individual must stay with you for 24 hours following the procedure.  For the next 24 hours, DO NOT: -Drive a car -Social workerperate  machinery -Drink alcoholic beverages -Take any medication unless instructed by your physician -Make any legal decisions or sign important papers.  Meals: Start with liquid foods such as gelatin or soup. Progress to regular foods as tolerated. Avoid greasy, spicy, heavy foods. If nausea and/or vomiting occur, drink only clear liquids until the nausea and/or vomiting subsides. Call your physician if vomiting continues.  Special Instructions/Symptoms: Your throat may feel dry or sore from the anesthesia or the breathing tube placed in your throat during surgery. If this causes discomfort, gargle with warm salt water. The discomfort should disappear within 24 hours.  If you had a scopolamine patch placed behind your ear for the management of post- operative nausea and/or vomiting:  1. The medication in the patch is effective for 72 hours, after which it should be removed.  Wrap patch in a tissue and discard in the trash. Wash hands thoroughly with soap and water. 2. You may remove the patch earlier than 72 hours if you experience unpleasant side effects which may include dry mouth, dizziness or visual disturbances. 3. Avoid touching the patch. Wash your hands with soap and water after contact with the patch.

## 2017-06-08 NOTE — Op Note (Addendum)
06/08/2017  10:31 AM  PATIENT:  Charles Franco  42 y.o. male  Patient Care Team: Patient, No Pcp Per as PCP - General (General Practice)  PRE-OPERATIVE DIAGNOSIS:  RECTAL BLEEDING, GRADE 3 HEMORRHOIDS  POST-OPERATIVE DIAGNOSIS:  RECTAL BLEEDING, GRADE 3 HEMORRHOIDS, CHRONIC ANAL FISSURE  PROCEDURE:   HEMORRHOIDECTOMY x3 COLUMNS, CHEMICAL SPHINCTEROTOMY    Surgeon(s): Leighton Ruff, MD  ASSISTANT: none   ANESTHESIA:   local and MAC  SPECIMEN:  Source of Specimen:  L lateral, R posterior and R anterior hemorrhoids  DISPOSITION OF SPECIMEN:  PATHOLOGY  COUNTS:  YES  PLAN OF CARE: Discharge to home after PACU  PATIENT DISPOSITION:  PACU - hemodynamically stable.  INDICATION: 42 y.o. M with rectal bleeding and anemia with grade 3 hemorrhoids   OR FINDINGS: Grade 3 hemorrhoids, all 3 positions.  Chronic appearing anal fissure.  DESCRIPTION: the patient was identified in the preoperative holding area and taken to the OR where they were laid on the operating room table.  MAC anesthesia was induced without difficulty. The patient was then positioned in prone jackknife position with buttocks gently taped apart.  The patient was then prepped and draped in usual sterile fashion.  SCDs were noted to be in place prior to the initiation of anesthesia. A surgical timeout was performed indicating the correct patient, procedure, positioning and need for preoperative antibiotics.  A rectal block was performed using Marcaine with epinephrine mixed with Experel.    I began with a digital rectal exam.  The anal canal was dilated slowly.  The patient was noted to have grade 3 hemorrhoids.  I then placed a Hill-Ferguson anoscope into the anal canal and evaluated this completely.  There was significant prolapse at all 3 columns.  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  I began in the left lateral hemorrhoid column elevating this off of the  sphincter complex using Allis clamps.  I divided the skin with a 15 blade scalpel and then dissected down to the level of the external sphincter complex using blunt dissection and Metzenbaum scissors.  I then removed all hemorrhoid tissue above the sphincter complex using the Metzenbaum scissors I carried this down internally making sure to avoid the internal anal sphincter as well.  I divided the hemorrhoid with the scissors and then ligated its blood supply using a 2-0 chromic suture.  This was then used to run the anoderm closed to the level of the dentate line.  I then resected residual hemorrhoid tissue underneath the skin flaps using blunt dissection and Metzenbaum scissors.  The anoderm was then closed proximal to the dentate line with a running 3-0 chromic suture.  Hemostasis was good.  I then turned my attention to the patient's right sided hemorrhoids.  The right posterior hemorrhoid was taken in similar fashion.  The right anterior hemorrhoid was then also removed in similar fashion.  Once this was completed, I injected 50 units of Botox into the intersphincteric groove to perform a chemical sphincterotomy and help his anal fissure heal after surgery.  This will also help with postoperative pain control.  Lidocaine ointment and sterile dressing were applied.  The patient was then awakened from anesthesia and sent to the postanesthesia care unit.  All counts were correct per operating room staff.  I reviewed the [facility] database.  No matching patient was identified.

## 2017-06-08 NOTE — Anesthesia Preprocedure Evaluation (Addendum)
Anesthesia Evaluation  Patient identified by MRN, date of birth, ID band Patient awake    Reviewed: Allergy & Precautions, NPO status , Patient's Chart, lab work & pertinent test results  Airway Mallampati: II  TM Distance: >3 FB Neck ROM: Full    Dental no notable dental hx. (+) Teeth Intact, Dental Advisory Given   Pulmonary Current Smoker,    Pulmonary exam normal breath sounds clear to auscultation       Cardiovascular Exercise Tolerance: Good negative cardio ROS Normal cardiovascular exam+ Valvular Problems/Murmurs MR  Rhythm:Regular Rate:Normal  LV EF: 60% -   65%  ------------------------------------------------------------------- Indications:      CHF - 428.0.  ------------------------------------------------------------------- History:   Risk factors:  Current tobacco use.  ------------------------------------------------------------------- Study Conclusions  - Left ventricle: The cavity size was normal. There was mild   concentric hypertrophy. Systolic function was normal. The   estimated ejection fraction was in the range of 60% to 65%. Wall   motion was normal; there were no regional wall motion   abnormalities. Left ventricular diastolic function parameters   were normal. - Aortic valve: Transvalvular velocity was within the normal range.   There was no stenosis. There was no regurgitation. - Mitral valve: There was trivial regurgitation. - Left atrium: The atrium was mildly dilated. - Right ventricle: The cavity size was normal. Wall thickness was   normal. Systolic function was normal. - Atrial septum: No defect or patent foramen ovale was identified   by color flow Doppler. - Tricuspid valve: There was trivial regurgitation. - Pulmonary arteries: Systolic pressure was within the normal   range. PA peak pressure: 35 mm Hg (S).    Neuro/Psych negative neurological ROS  negative psych ROS    GI/Hepatic negative GI ROS, Neg liver ROS,   Endo/Other  negative endocrine ROS  Renal/GU negative Renal ROS  negative genitourinary   Musculoskeletal negative musculoskeletal ROS (+)   Abdominal   Peds negative pediatric ROS (+)  Hematology negative hematology ROS (+) anemia ,   Anesthesia Other Findings   Reproductive/Obstetrics negative OB ROS                          Anesthesia Physical Anesthesia Plan  ASA: II  Anesthesia Plan: MAC   Post-op Pain Management:    Induction: Intravenous  PONV Risk Score and Plan: 1 and Ondansetron, Propofol infusion and Midazolam  Airway Management Planned: Simple Face Mask  Additional Equipment:   Intra-op Plan:   Post-operative Plan:   Informed Consent: I have reviewed the patients History and Physical, chart, labs and discussed the procedure including the risks, benefits and alternatives for the proposed anesthesia with the patient or authorized representative who has indicated his/her understanding and acceptance.   Dental advisory given  Plan Discussed with: CRNA and Surgeon  Anesthesia Plan Comments:         Anesthesia Quick Evaluation

## 2017-06-12 ENCOUNTER — Encounter (HOSPITAL_BASED_OUTPATIENT_CLINIC_OR_DEPARTMENT_OTHER): Payer: Self-pay | Admitting: General Surgery

## 2018-03-03 ENCOUNTER — Emergency Department (HOSPITAL_BASED_OUTPATIENT_CLINIC_OR_DEPARTMENT_OTHER)
Admission: EM | Admit: 2018-03-03 | Discharge: 2018-03-03 | Disposition: A | Discharge status: Home / Self Care | Attending: Emergency Medicine | Admitting: Emergency Medicine

## 2018-03-03 ENCOUNTER — Encounter (HOSPITAL_BASED_OUTPATIENT_CLINIC_OR_DEPARTMENT_OTHER): Payer: Self-pay | Admitting: *Deleted

## 2018-03-03 ENCOUNTER — Emergency Department (HOSPITAL_BASED_OUTPATIENT_CLINIC_OR_DEPARTMENT_OTHER)

## 2018-03-03 ENCOUNTER — Other Ambulatory Visit: Payer: Self-pay

## 2018-03-03 DIAGNOSIS — R600 Localized edema: Secondary | ICD-10-CM | POA: Diagnosis present

## 2018-03-03 DIAGNOSIS — Z79899 Other long term (current) drug therapy: Secondary | ICD-10-CM | POA: Diagnosis not present

## 2018-03-03 DIAGNOSIS — L988 Other specified disorders of the skin and subcutaneous tissue: Secondary | ICD-10-CM | POA: Diagnosis not present

## 2018-03-03 DIAGNOSIS — F17210 Nicotine dependence, cigarettes, uncomplicated: Secondary | ICD-10-CM | POA: Diagnosis not present

## 2018-03-03 DIAGNOSIS — R102 Pelvic and perineal pain: Secondary | ICD-10-CM | POA: Diagnosis not present

## 2018-03-03 LAB — I-STAT CG4 LACTIC ACID, ED: Lactic Acid, Venous: 0.79 mmol/L (ref 0.5–1.9)

## 2018-03-03 LAB — COMPREHENSIVE METABOLIC PANEL
ALBUMIN: 3.9 g/dL (ref 3.5–5.0)
ALT: 27 U/L (ref 0–44)
ANION GAP: 8 (ref 5–15)
AST: 25 U/L (ref 15–41)
Alkaline Phosphatase: 106 U/L (ref 38–126)
BILIRUBIN TOTAL: 0.7 mg/dL (ref 0.3–1.2)
BUN: 11 mg/dL (ref 6–20)
CO2: 24 mmol/L (ref 22–32)
Calcium: 8.8 mg/dL — ABNORMAL LOW (ref 8.9–10.3)
Chloride: 103 mmol/L (ref 98–111)
Creatinine, Ser: 0.98 mg/dL (ref 0.61–1.24)
GFR calc Af Amer: >60 mL/min (ref >60)
GFR calc non Af Amer: >60 mL/min (ref >60)
GLUCOSE: 108 mg/dL — AB (ref 70–99)
POTASSIUM: 3.6 mmol/L (ref 3.5–5.1)
SODIUM: 135 mmol/L (ref 135–145)
TOTAL PROTEIN: 7.5 g/dL (ref 6.5–8.1)

## 2018-03-03 LAB — CBC WITH DIFFERENTIAL/PLATELET
ABS IMMATURE GRANULOCYTES: 0.03 10*3/uL (ref 0.00–0.07)
BASOS ABS: 0 10*3/uL (ref 0.0–0.1)
Basophils Relative: 0 %
EOS PCT: 1 %
Eosinophils Absolute: 0.1 10*3/uL (ref 0.0–0.5)
HEMATOCRIT: 40.3 % (ref 39.0–52.0)
HEMOGLOBIN: 12.9 g/dL — AB (ref 13.0–17.0)
IMMATURE GRANULOCYTES: 0 %
LYMPHS ABS: 1.6 10*3/uL (ref 0.7–4.0)
LYMPHS PCT: 18 %
MCH: 27.4 pg (ref 26.0–34.0)
MCHC: 32 g/dL (ref 30.0–36.0)
MCV: 85.7 fL (ref 80.0–100.0)
Monocytes Absolute: 0.9 10*3/uL (ref 0.1–1.0)
Monocytes Relative: 10 %
NEUTROS ABS: 6.3 10*3/uL (ref 1.7–7.7)
NRBC: 0 % (ref 0.0–0.2)
Neutrophils Relative %: 71 %
PLATELETS: 183 10*3/uL (ref 150–400)
RBC: 4.7 MIL/uL (ref 4.22–5.81)
RDW: 15.4 % (ref 11.5–15.5)
WBC: 9 10*3/uL (ref 4.0–10.5)

## 2018-03-03 MED ORDER — IOPAMIDOL (ISOVUE-300) INJECTION 61%
100.0000 mL | Freq: Once | INTRAVENOUS | Status: AC
  Administered 2018-03-03: 100 mL via INTRAVENOUS

## 2018-03-03 MED ORDER — MORPHINE SULFATE (PF) 4 MG/ML IV SOLN
4.0000 mg | Freq: Once | INTRAVENOUS | Status: DC
Start: 2018-03-03 — End: 2018-03-04

## 2018-03-03 MED ORDER — POLYETHYLENE GLYCOL 3350 17 GM/SCOOP PO POWD: ORAL | 0 refills | Status: AC

## 2018-03-03 MED ORDER — DOXYCYCLINE HYCLATE 100 MG PO TABS
100.0000 mg | ORAL_TABLET | Freq: Once | ORAL | Status: AC
  Administered 2018-03-03: 100 mg via ORAL
  Filled 2018-03-03: qty 1

## 2018-03-03 MED ORDER — DOXYCYCLINE HYCLATE 100 MG PO CAPS: 100.0000 mg | ORAL_CAPSULE | Freq: Two times a day (BID) | ORAL | 0 refills | Status: AC

## 2018-03-03 MED ORDER — HYDROCODONE-ACETAMINOPHEN 5-325 MG PO TABS: 1.0000 | ORAL_TABLET | Freq: Four times a day (QID) | ORAL | 0 refills | Status: AC | PRN

## 2018-03-03 NOTE — ED Notes (Signed)
Patient transported to CT 

## 2018-03-03 NOTE — Discharge Instructions (Signed)
Take antibiotics as prescribed.  Take the entire course, even if symptoms improve. Use tylenol and ibuprofen as needed for mild to moderate pain.  Use Norco as needed for severe breakthrough pain.  Have caution, as this is a narcotic pain medicine.  Do not drive while taking this medicine. Use MiraLAX early to encourage loose stools and decrease pain. Sit in a sitz bath for 15 minutes 3 times a day.  Follow up with general surgery on Monday for further evaluation of your symptoms. Return to the emergency room if you develop high fevers, persistent vomiting, severe abdominal pain, severe worsening rectal pain, or any new or concerning symptoms.

## 2018-03-03 NOTE — ED Provider Notes (Signed)
[facility] EMERGENCY DEPARTMENT Provider Note   CSN: [id number] Arrival date & time: 03/03/18  2009     History   Chief Complaint Chief Complaint  Patient presents with  . Abscess    HPI Charles Franco is a 43 y.o. male presenting for evaluation of rectal abscess.  Patient states for the past 6 days, he has been having pain of his left buttock.  5 days ago, he noticed swelling/hardness of the area.  He reports pain with bowel movements.  He denies fevers, chills, drainage.  He denies nausea, vomiting, abdominal pains.  He has not taken anything for pain including Tylenol or ibuprofen.  Nothing makes the pain better.  He reports history of recent hemorrhoidectomy in April, but has not had any other surgeries or issues with his rectum.  He has no medical problems, takes no medications daily.  He is not on blood thinners.  He denies hematochezia or melena.  HPI  Past Medical History:  Diagnosis Date  . Chest pain 12/2016  . External hemorrhoids   . Grade III internal hemorrhoids   . Headache   . Heart murmur    mild due to anemia  . History of blood transfusion 12/2016   4 units   . History of colon polyps   . History of rectal bleeding   . Iron deficiency anemia    IV feraheme    Patient Active Problem List   Diagnosis Date Noted  . Iron deficiency anemia due to chronic blood loss   . Hematochezia   . Anemia 01/13/2017    Past Surgical History:  Procedure Laterality Date  . COLONOSCOPY WITH PROPOFOL N/A 01/16/2017   Procedure: COLONOSCOPY WITH PROPOFOL;  Surgeon: Meryl Dare, MD;  Location: [facility];  Service: Endoscopy;  Laterality: N/A;  . HEMORRHOID SURGERY N/A 06/08/2017   Procedure: HEMORRHOIDECTOMY x3, SPHINCTEROTOMY AND BOTOX INJECTION;  Surgeon: Romie Levee, MD;  Location: [facility] [place];  Service: General;  Laterality: N/A;  . MOUTH SURGERY          Home Medications    Prior to Admission medications   Medication Sig  Start Date End Date Taking? Authorizing Provider  acetaminophen (TYLENOL) 500 MG tablet Take 1,000 mg by mouth every 6 (six) hours as needed for moderate pain, fever or headache.    [provider]  diazepam (VALIUM) 5 MG tablet Take 1 tablet (5 mg total) by mouth every 6 (six) hours as needed (urinary retention). 06/08/17 06/08/18  Romie Levee, MD  docusate sodium (COLACE) 100 MG capsule Take 2 capsules (200 mg total) by mouth 2 (two) times daily. 01/17/17   Leroy Sea, MD  doxycycline (VIBRAMYCIN) 100 MG capsule Take 1 capsule (100 mg total) by mouth 2 (two) times daily. 03/03/18   Karalyn Kadel, PA-C  ferrous sulfate 325 (65 FE) MG tablet Take 1 tablet (325 mg total) by mouth 2 (two) times daily with a meal. 01/17/17   Leroy Sea, MD  HYDROcodone-acetaminophen (NORCO/VICODIN) 5-325 MG tablet Take 1 tablet by mouth every 6 (six) hours as needed for severe pain. 03/03/18   Joanthan Hlavacek, PA-C  oxyCODONE (OXY IR/ROXICODONE) 5 MG immediate release tablet Take 1-2 tablets (5-10 mg total) by mouth every 6 (six) hours as needed. 06/08/17   Romie Levee, MD  polyethylene glycol powder [facility]) powder Take one capful by mouth nightly while taking pain mediciation 03/03/18   Nayshawn Mesta, PA-C    Family History No family history on file.  Social History Social History   Tobacco Use  . Smoking status: Current Every Day Smoker    Packs/day: 1.00    Years: 14.00    Pack years: 14.00    Types: Cigarettes  . Smokeless tobacco: Never Used  Substance Use Topics  . Alcohol use: Yes    Comment: 1-2 beers on weekend  . Drug use: No     Allergies   Patient has no known allergies.   Review of Systems Review of Systems  Gastrointestinal: Positive for rectal pain.       Perirectal pain and pain with bowel movements  All other systems reviewed and are negative.    Physical Exam Updated Vital Signs BP (!) 149/68 (BP Location: Left Arm)   Pulse (!)  108   Temp 99.8 F (37.7 C) (Oral)   Resp 16   Ht 6' (1.829 m)   Wt 83.9 kg   SpO2 100%   BMI 25.09 kg/m   Physical Exam Vitals signs and nursing note reviewed. Exam conducted with a chaperone present.  Constitutional:      General: He is not in acute distress.    Appearance: He is well-developed.     Comments: Appears uncomfortable due to pain, but no acute distress  HENT:     Head: Normocephalic and atraumatic.  Eyes:     Conjunctiva/sclera: Conjunctivae normal.     Pupils: Pupils are equal, round, and reactive to light.  Neck:     Musculoskeletal: Normal range of motion and neck supple.  Cardiovascular:     Rate and Rhythm: Normal rate and regular rhythm.  Pulmonary:     Effort: Pulmonary effort is normal. No respiratory distress.     Breath sounds: Normal breath sounds. No wheezing.  Abdominal:     General: Bowel sounds are normal. There is no distension.     Palpations: Abdomen is soft.     Tenderness: There is no abdominal tenderness.  Genitourinary:      Comments: Area of tenderness and induration.  No active drainage.  No fluctuance. Musculoskeletal: Normal range of motion.  Skin:    General: Skin is warm and dry.     Capillary Refill: Capillary refill takes less than 2 seconds.  Neurological:     Mental Status: He is alert and oriented to person, place, and time.      ED Treatments / Results  Labs (all labs ordered are listed, but only abnormal results are displayed) Labs Reviewed  CBC WITH DIFFERENTIAL/PLATELET - Abnormal; Notable for the following components:      Result Value   Hemoglobin 12.9 (*)    All other components within normal limits  COMPREHENSIVE METABOLIC PANEL - Abnormal; Notable for the following components:   Glucose, Bld 108 (*)    Calcium 8.8 (*)    All other components within normal limits  I-STAT CG4 LACTIC ACID, ED  I-STAT CG4 LACTIC ACID, ED    EKG None  Radiology Ct Pelvis W Contrast  Result Date: 03/03/2018 CLINICAL  DATA:  Blow in the buttocks region. Possible perirectal abscess. EXAM: CT PELVIS WITH CONTRAST TECHNIQUE: Multidetector CT imaging of the pelvis was performed using the standard protocol following the bolus administration of intravenous contrast. CONTRAST:  100mL ISOVUE-300 IOPAMIDOL (ISOVUE-300) INJECTION 61% COMPARISON:  None. FINDINGS: Urinary Tract:  Bladder unremarkable. Bowel: No small bowel or colonic dilatation within the visualized pelvis. Rectum unremarkable. Abnormal linear track of soft tissue attenuation tracks to the left of the lower rectum and anus  down to the skin of the intergluteal fold. No focal fluid collection to suggest drainable abscess. Vascular/Lymphatic: Major arterial and venous anatomy along the pelvic sidewalls is patent. No pelvic sidewall lymphadenopathy. Reproductive: The prostate gland and seminal vesicles are unremarkable. Other:  No intraperitoneal free fluid. Musculoskeletal: No worrisome lytic or sclerotic osseous abnormality. IMPRESSION: A linear tract of soft tissue attenuation with subtle edema/inflammation tracks along the left lower rectum/anus down to the skin of the intergluteal fold. Imaging features highly suspicious for fistula. No focal fluid collection to suggest drainable abscess at this time. Electronically Signed   By: Kennith Center M.D.   On: 03/03/2018 21:49    Procedures Procedures (including critical care time)  Medications Ordered in ED Medications  morphine 4 MG/ML injection 4 mg (0 mg Intravenous Hold 03/03/18 2051)  iopamidol (ISOVUE-300) 61 % injection 100 mL (100 mLs Intravenous Contrast Given 03/03/18 2124)  doxycycline (VIBRA-TABS) tablet 100 mg (100 mg Oral Given 03/03/18 2220)     Initial Impression / Assessment and Plan / ED Course  I have reviewed the triage vital signs and the nursing notes.  Pertinent labs & imaging results that were available during my care of the patient were reviewed by me and considered in my medical decision  making (see chart for details).     Pt presenting for evaluation of perirectal pain and pain with bowel movements.  Physical exam shows patient who is mildly tachycardic with low-grade temp of 99.8.  On exam, he is tenderness of the perirectal area abutting the anal sphincter.  As such, I am concerned for possible abscess extending into the sphincter or tracking deeper into the perineum.  Will obtain labs and CT pelvis.  labs reassuring, no leukocytosis.  Lactic negative.  CT pending.  CT does not show any obvious abscess, but concern for fistula.  Discussed with attending, Dr. Clarene Duke agrees to plan.  Will consult with general surgery for further evaluation.  Discussed with Dr. Ezzard Standing from general surgery, recommends follow-up in the office on Monday, as there is no apparent abscess or infection at this time.  As patient is mildly tachycardic with a possible low-grade fever, will discharge with doxycycline.  Pain medicine and MiraLAX given.  BMP checked, patient without concerning controlled substance use.  Encouraged to use a sitz bath 3 times a day for 15 minutes per Dr. Allene Pyo recommendations.  At this time, patient appears safe for discharge.  Return precautions given.  Patient states he understands and agrees plan.   Final Clinical Impressions(s) / ED Diagnoses   Final diagnoses:  Fistula    ED Discharge Orders         Ordered    doxycycline (VIBRAMYCIN) 100 MG capsule  2 times daily     03/03/18 2216    polyethylene glycol powder (GLYCOLAX/MIRALAX) powder     03/03/18 2216    HYDROcodone-acetaminophen (NORCO/VICODIN) 5-325 MG tablet  Every 6 hours PRN     03/03/18 2216           Enriqueta Augusta, PA-C 03/03/18 2336    Little, Ambrose Finland, MD 03/04/18 0015

## 2018-03-03 NOTE — ED Triage Notes (Signed)
Boil to buttocks since tuesday

## 2018-03-03 NOTE — ED Notes (Signed)
ED Provider at bedside. 

## 2018-10-07 IMAGING — CT CT HEAD W/O CM
3 series · 16 of 47 positions shown, 19 images · non-contrast
Comparison: None.

CLINICAL DATA: Headache and dizziness x1 week

EXAM:
CT HEAD WITHOUT CONTRAST
TECHNIQUE: Contiguous axial images were obtained from the base of the skull
through the vertex without intravenous contrast.

[Series 2: head wo · axial · 0.38mm/px · z∈[-177,-42]mm · 10 of 33 slices shown, 13 images]
[im 3/33  brain]
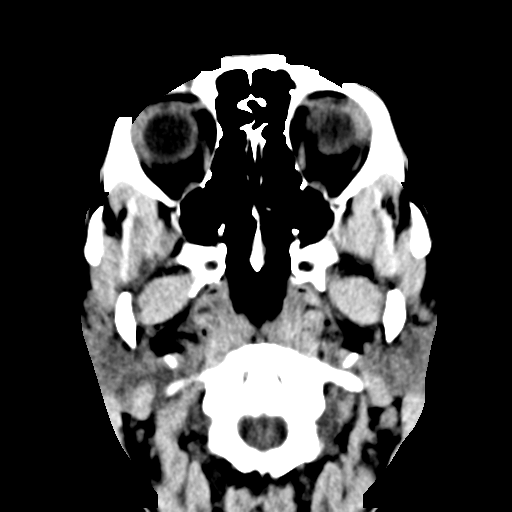
[im 3/33  bone]
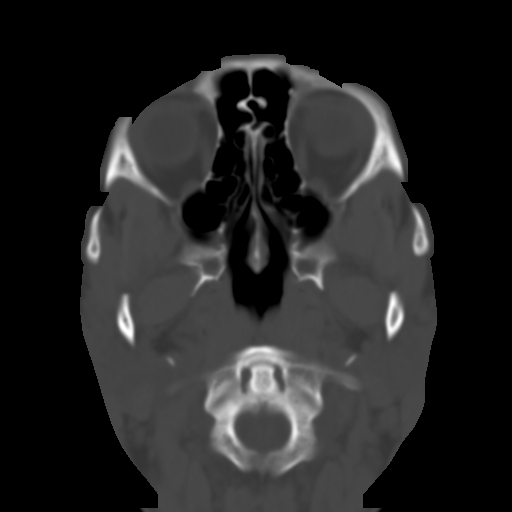
[im 6/33  brain]
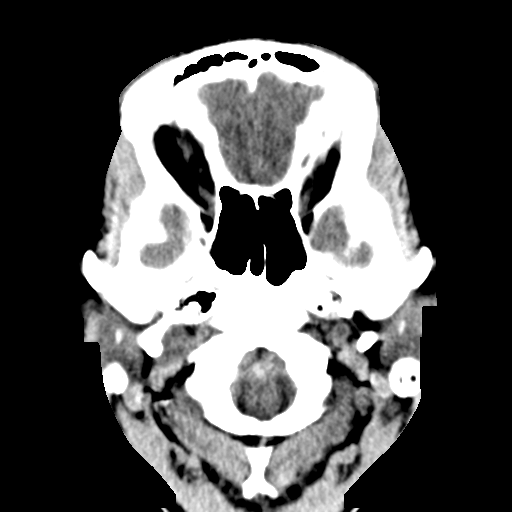
[im 9/33  brain]
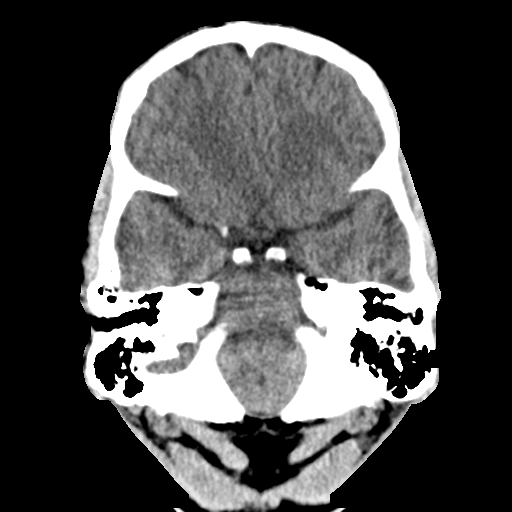
[im 12/33  brain]
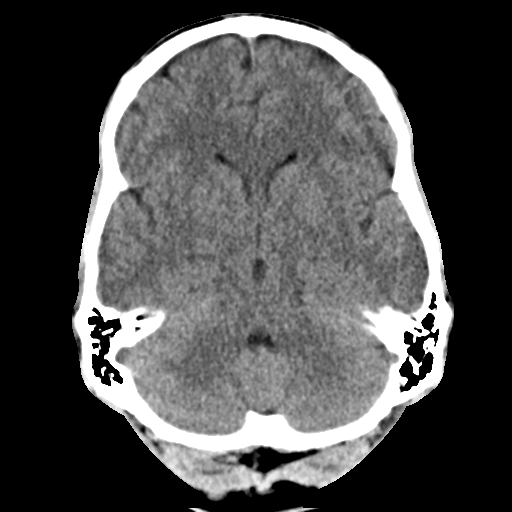
[im 15/33  brain]
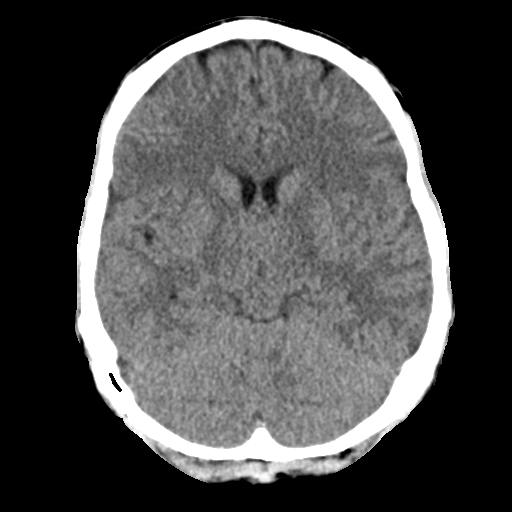
[im 15/33  bone]
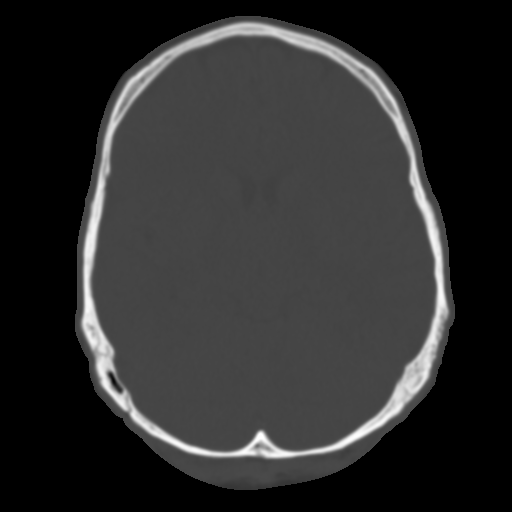
[im 18/33  brain]
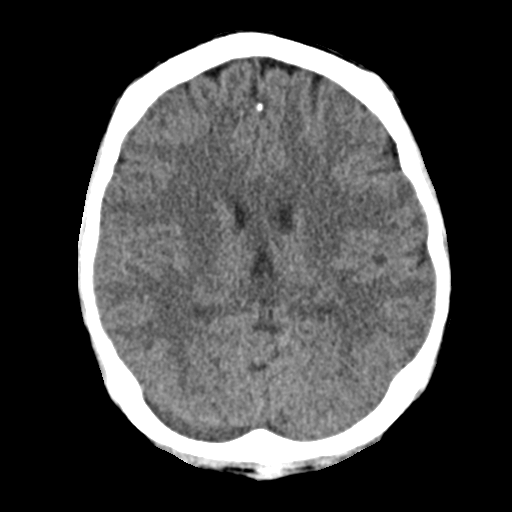
[im 21/33  brain]
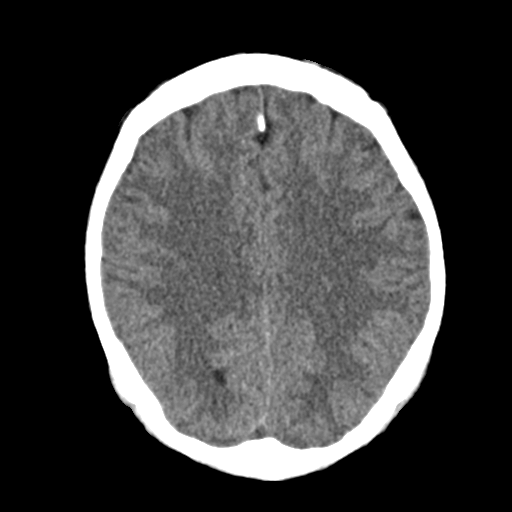
[im 25/33  brain]
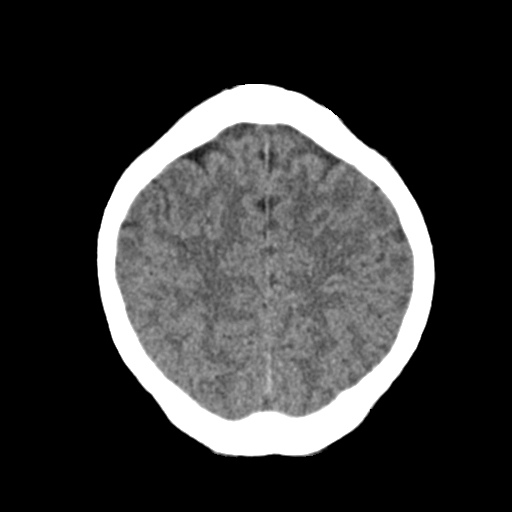
[im 27/33  brain]
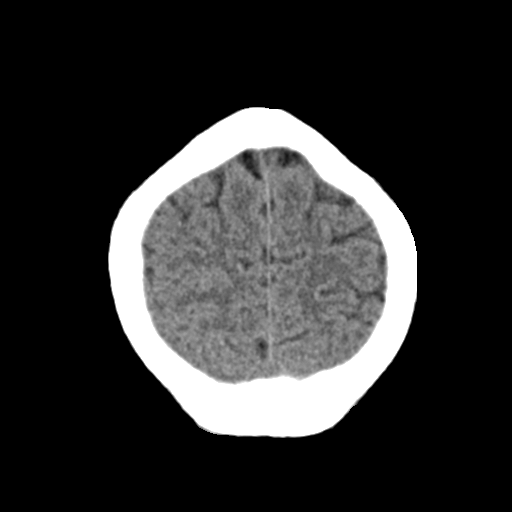
[im 27/33  bone]
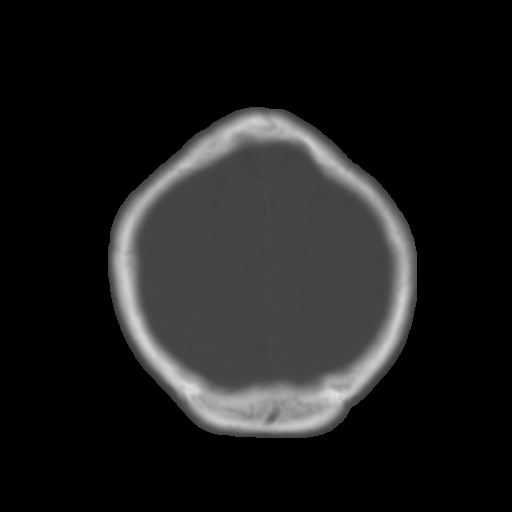
[im 30/33  brain]
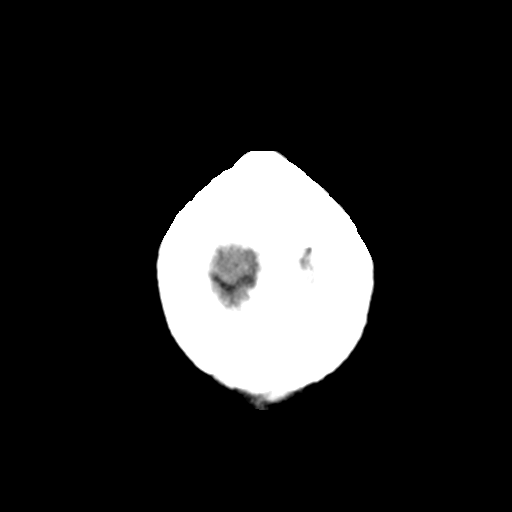

[Series 4: coronal soft · coronal · 0.34mm/px · 3 of 65 slices shown]
[im 22/65  brain]
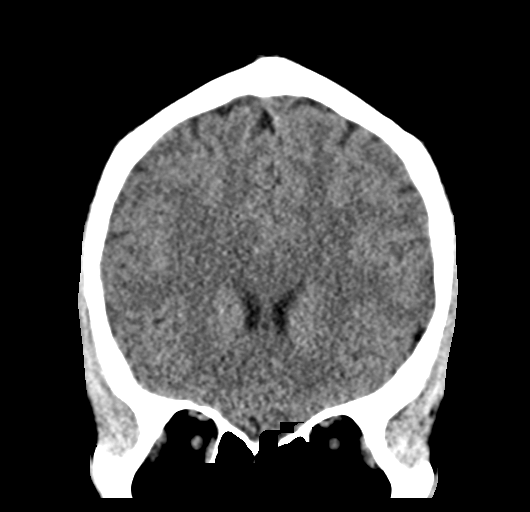
[im 29/65  brain]
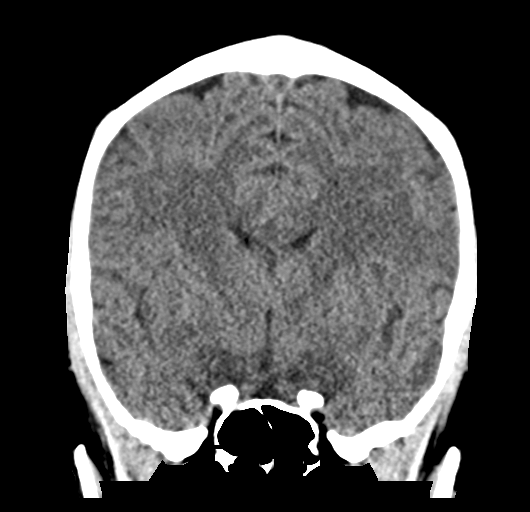
[im 36/65  brain]
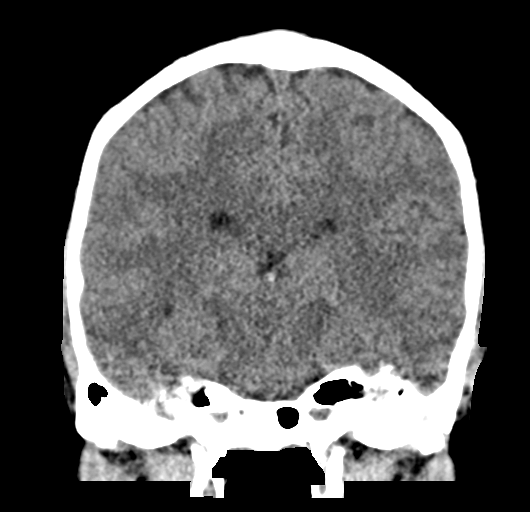

[Series 5: sag soft · sagittal · 0.32mm/px · 3 of 55 slices shown]
[im 19/55  brain]
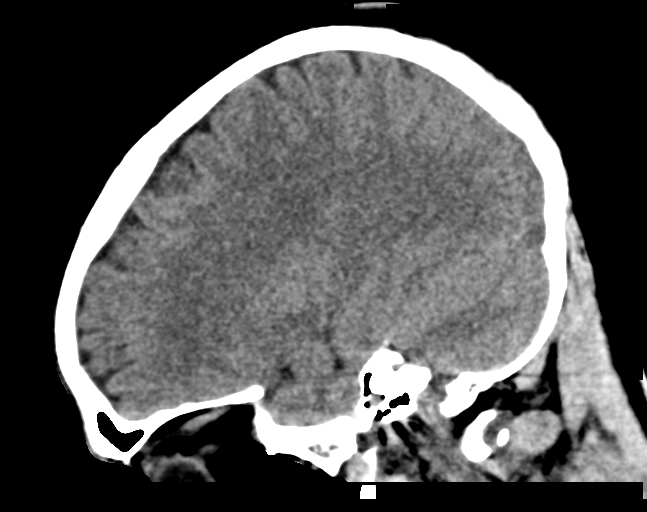
[im 28/55  brain]
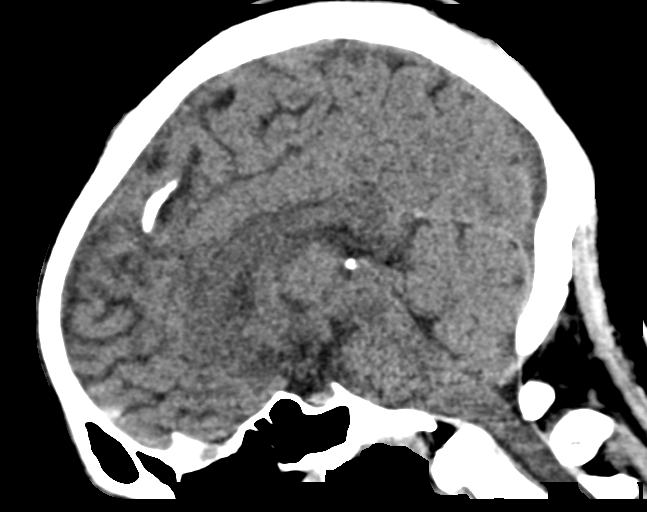
[im 37/55  brain]
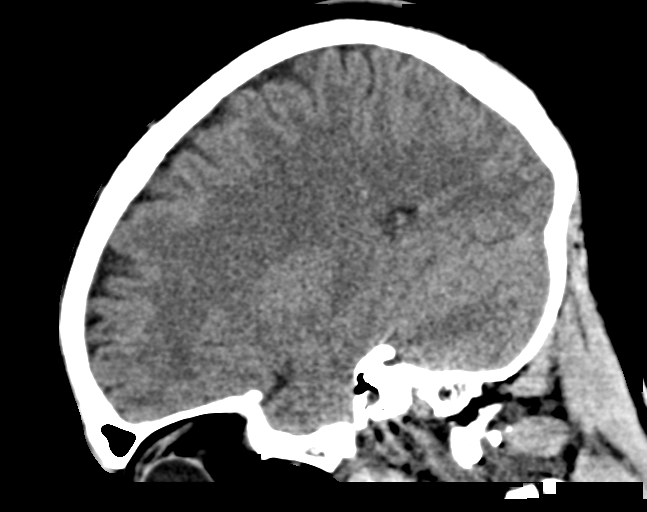

[16 of 47 positions shown; findings below may reference images not displayed]

FINDINGS: Brain: No evidence of acute infarction, hemorrhage, hydrocephalus,
extra-axial collection or mass lesion/mass effect.

Vascular: No hyperdense vessel or unexpected calcification.

Skull: Normal. Negative for fracture or focal lesion.

Sinuses/Orbits: No acute finding.

Other: None
IMPRESSION: No acute intracranial abnormality.

## 2018-10-07 IMAGING — DX DG CHEST 2V
2 series · 2 of 2 positions shown · non-contrast
Comparison: None.

CLINICAL DATA: Chest pain, worse today.

EXAM:
CHEST  2 VIEW

[chest pa]
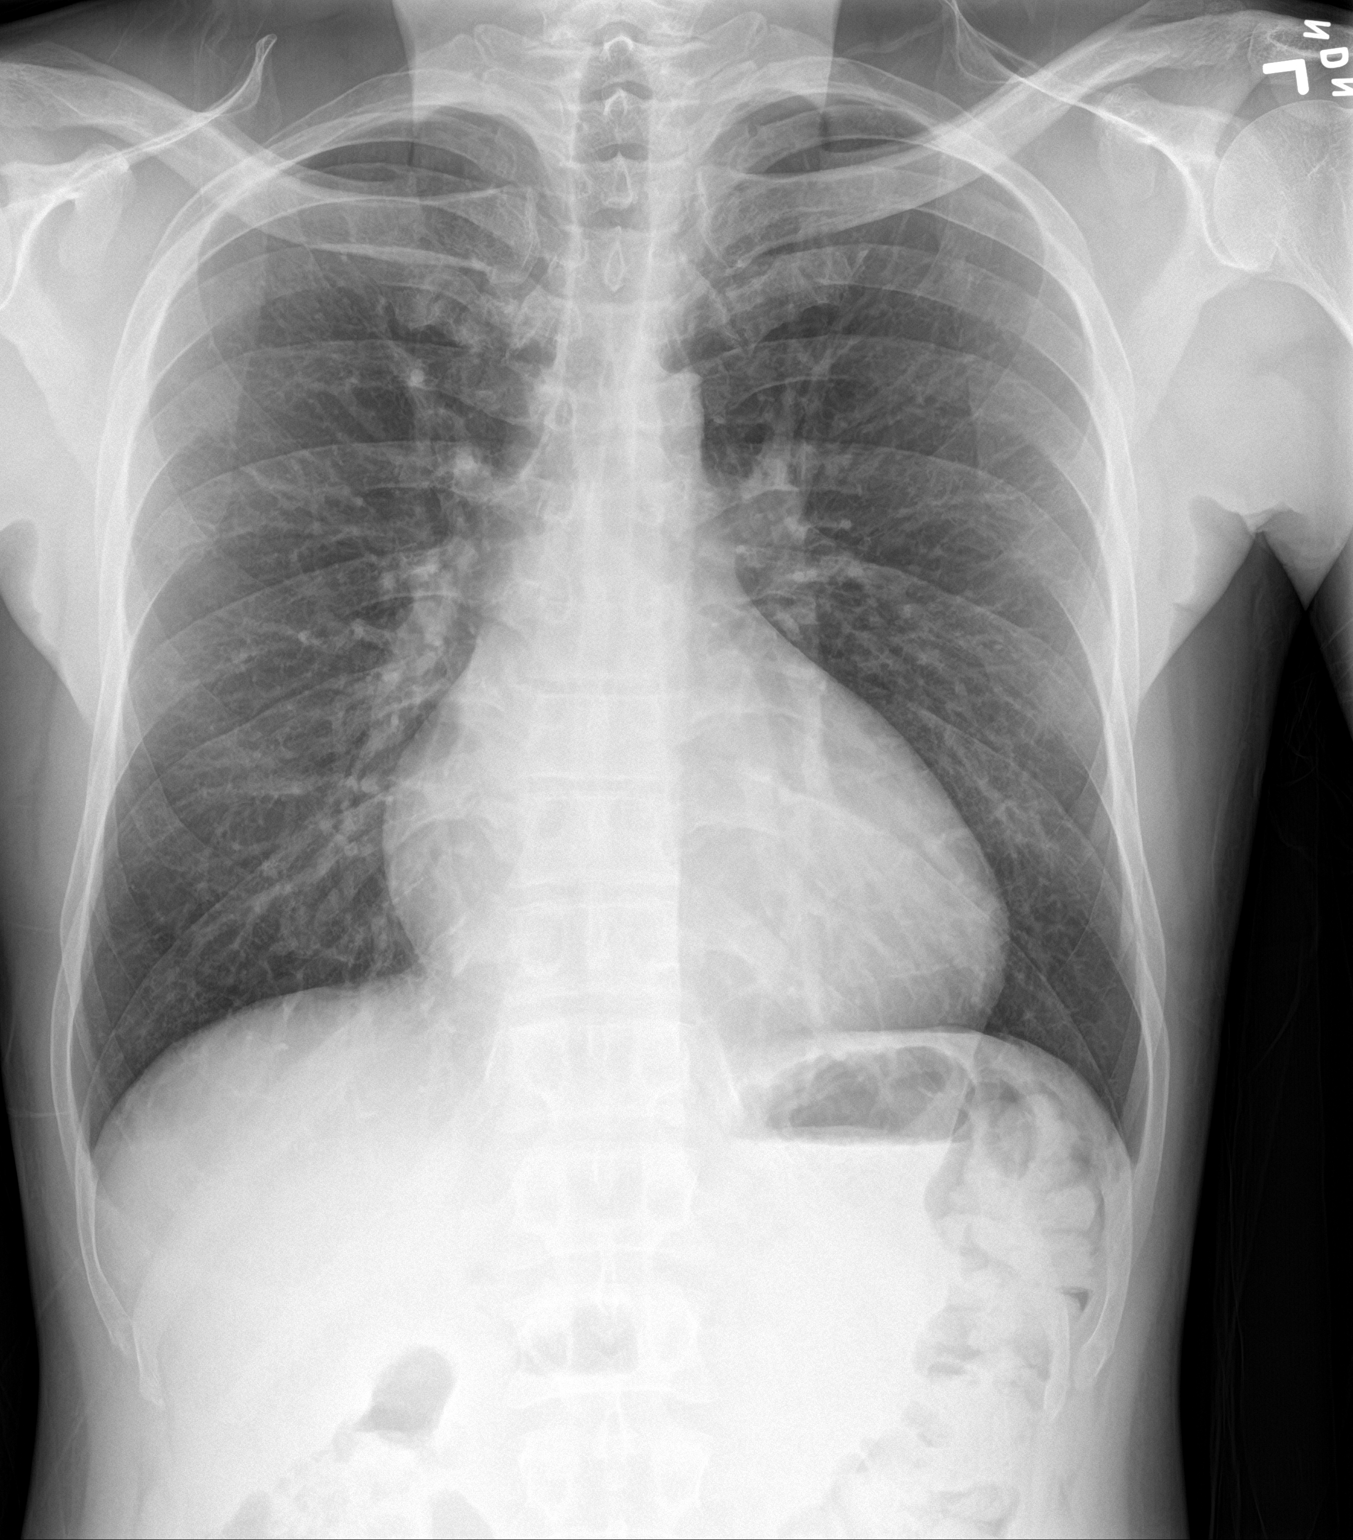

[chest lat]
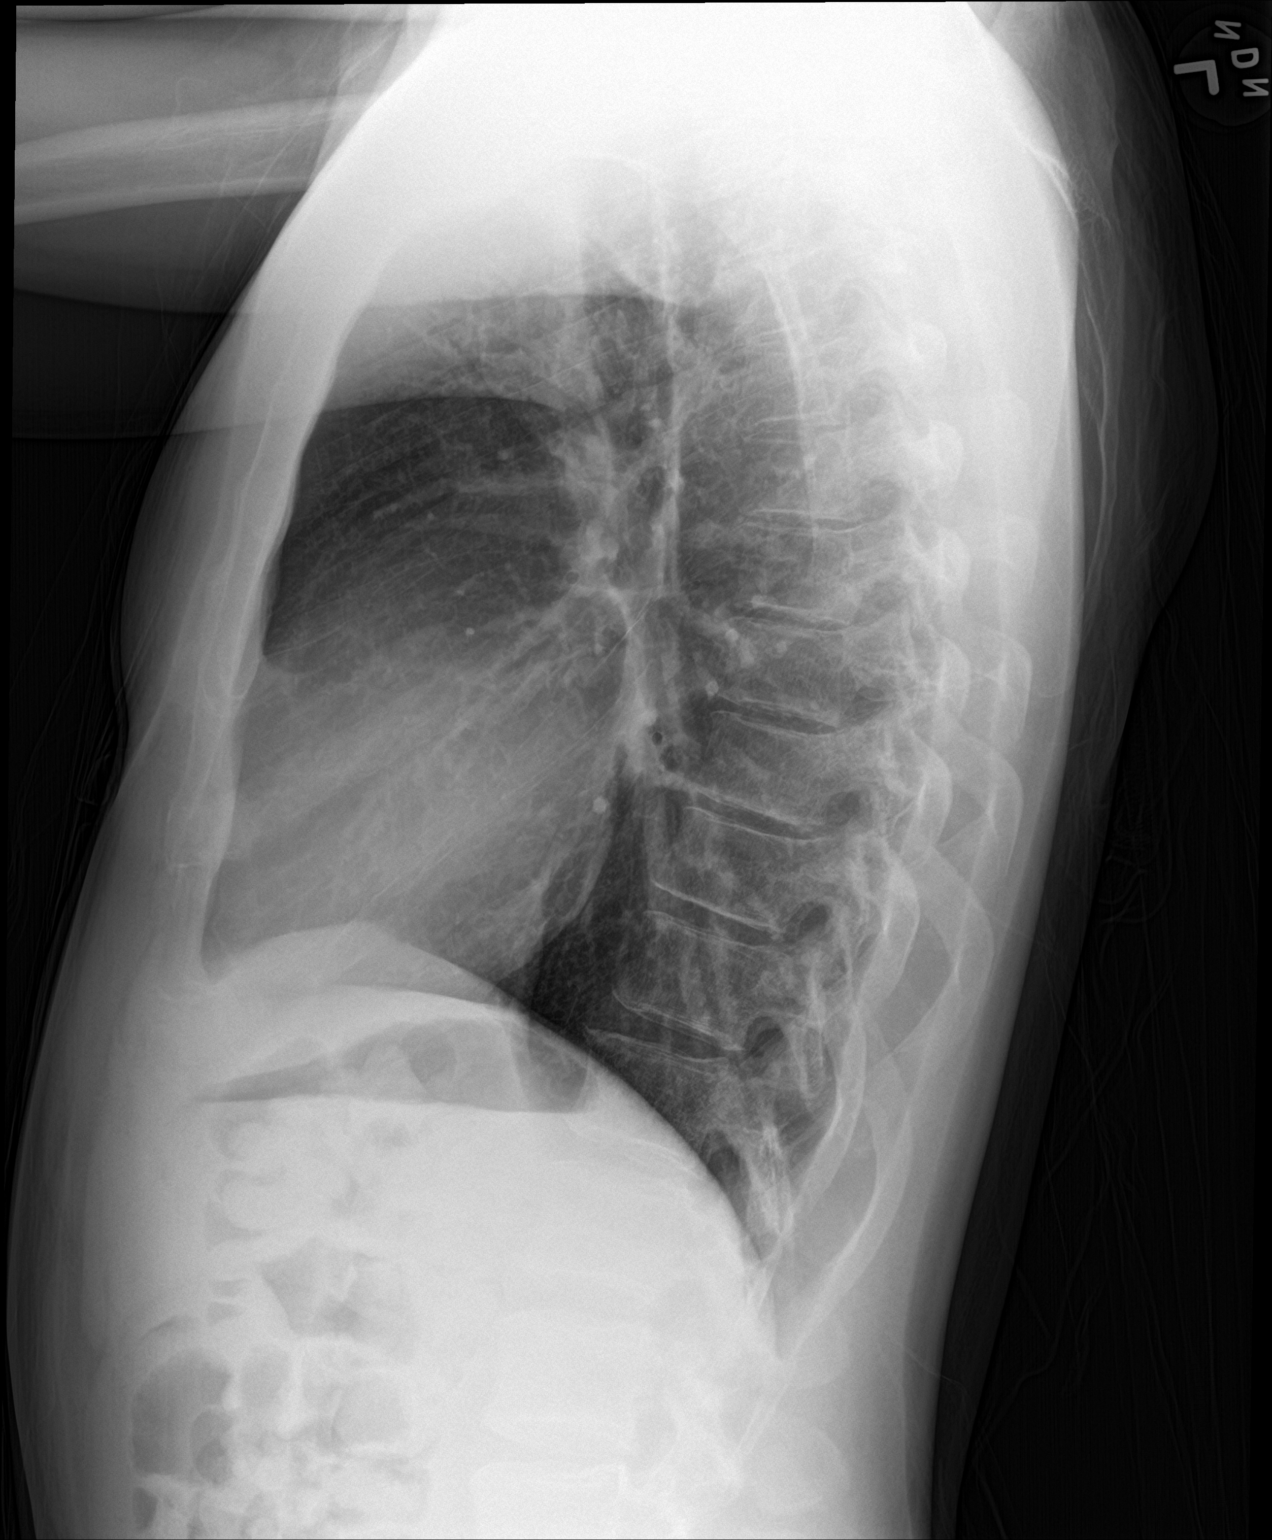

[2 of 2 positions shown; findings below may reference images not displayed]

FINDINGS: Mild cardiomegaly for age. Normal mediastinal contours. No pulmonary
edema. No consolidation, pleural fluid or pneumothorax. No acute
osseous abnormality.
IMPRESSION: Mild cardiomegaly. No congestive failure or acute pulmonary process.

## 2019-11-25 IMAGING — CT CT PELVIS W/ CM
2 of 3 series · 17 of 46 positions shown, 19 images · IV contrast (APPLIED)
Comparison: None.

CLINICAL DATA: Blow in the buttocks region. Possible perirectal
abscess.

EXAM:
CT PELVIS WITH CONTRAST
TECHNIQUE: Multidetector CT imaging of the pelvis was performed using the
standard protocol following the bolus administration of intravenous
contrast.
CONTRAST:  100mL QJHJ2Q-7YY IOPAMIDOL (QJHJ2Q-7YY) INJECTION 61%

[Series 2: axial st · axial · 0.82mm/px · z∈[-424,-154]mm · 14 of 62 slices shown, 16 images]
[im 4/62  soft-tissue]
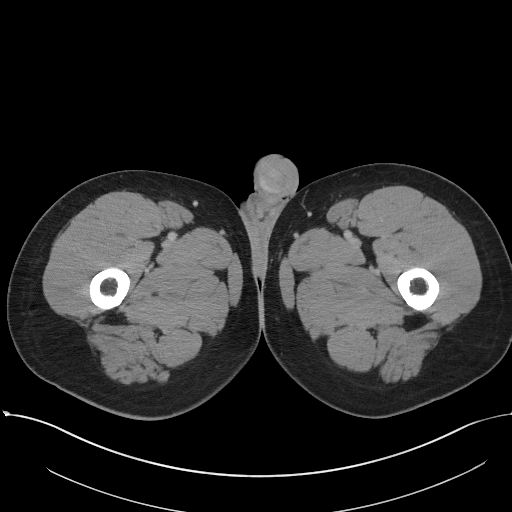
[im 4/62  bone]
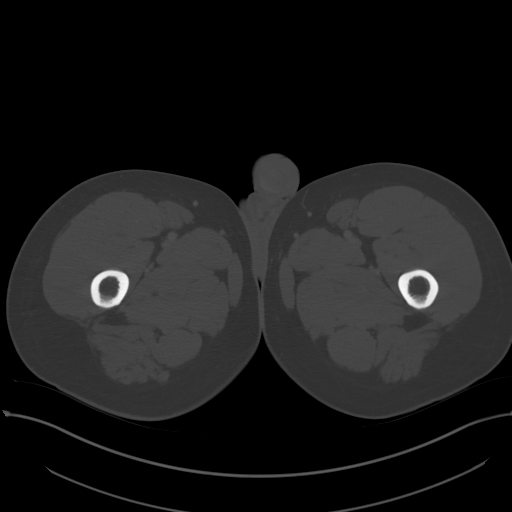
[im 8/62  soft-tissue]
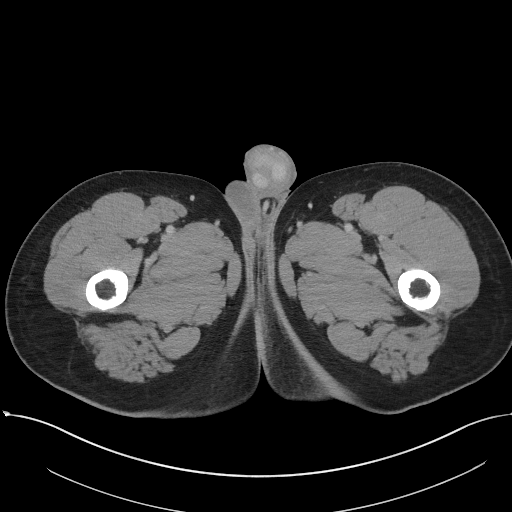
[im 12/62  soft-tissue]
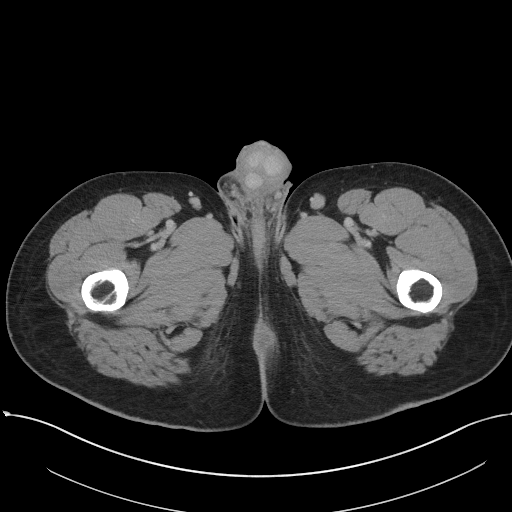
[im 16/62  soft-tissue]
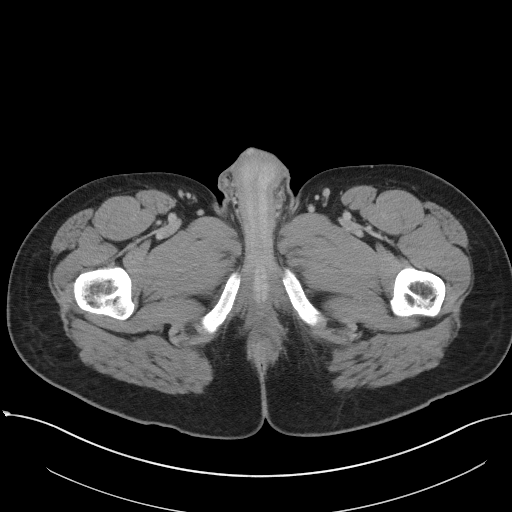
[im 20/62  soft-tissue]
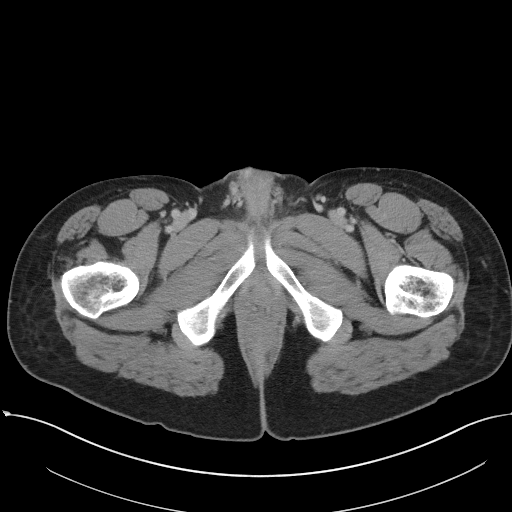
[im 24/62  soft-tissue]
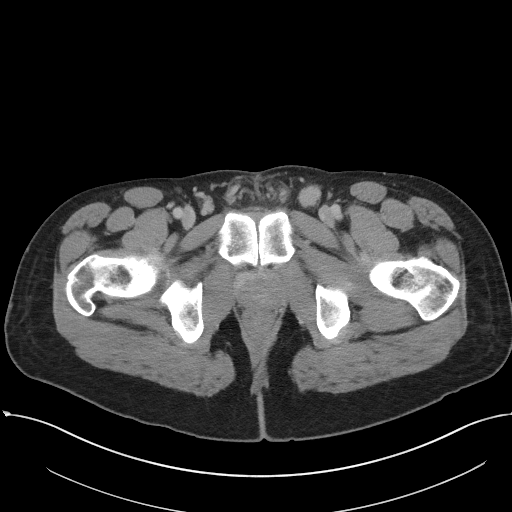
[im 28/62  soft-tissue]
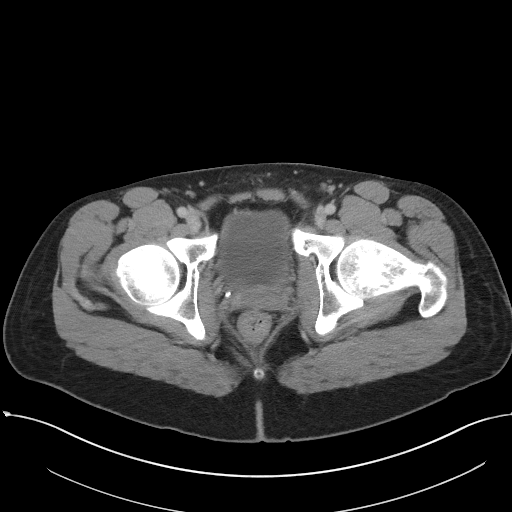
[im 34/62  soft-tissue]
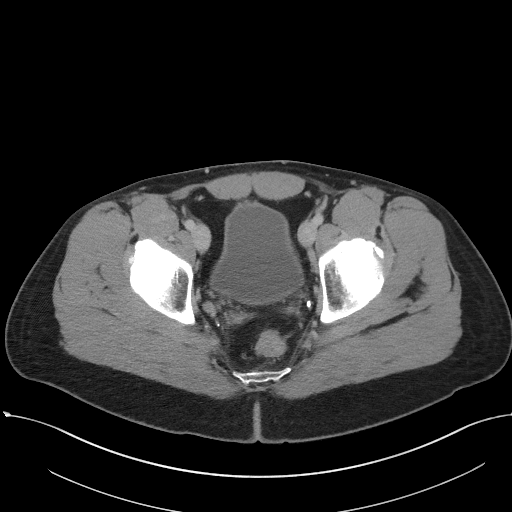
[im 38/62  soft-tissue]
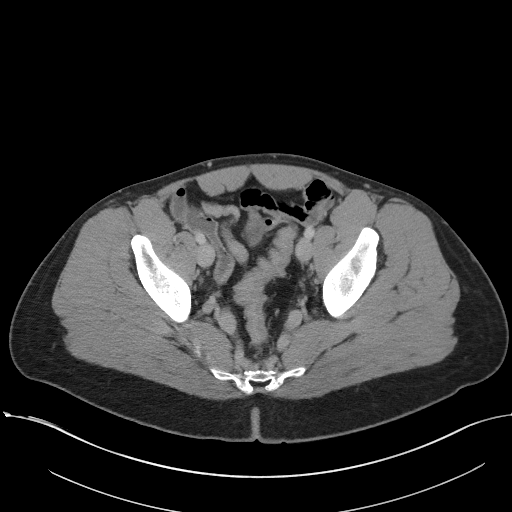
[im 38/62  bone]
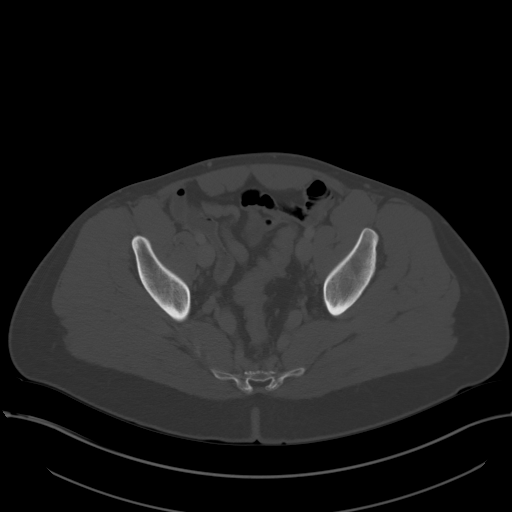
[im 42/62  soft-tissue]
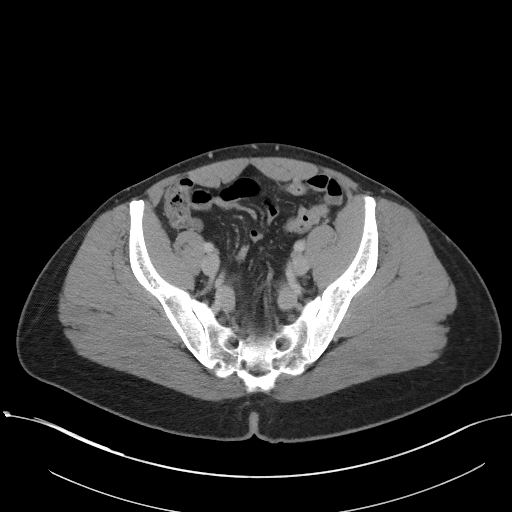
[im 46/62  soft-tissue]
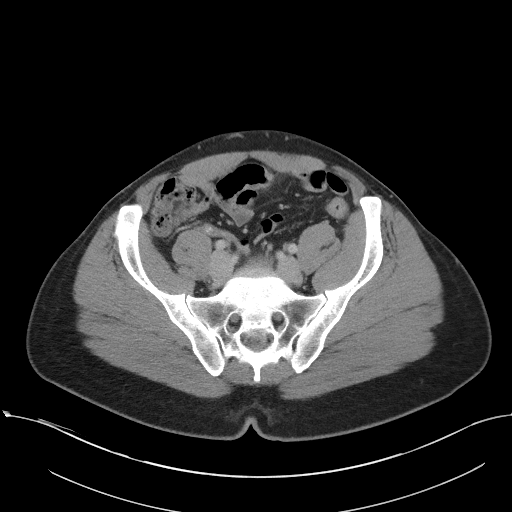
[im 50/62  soft-tissue]
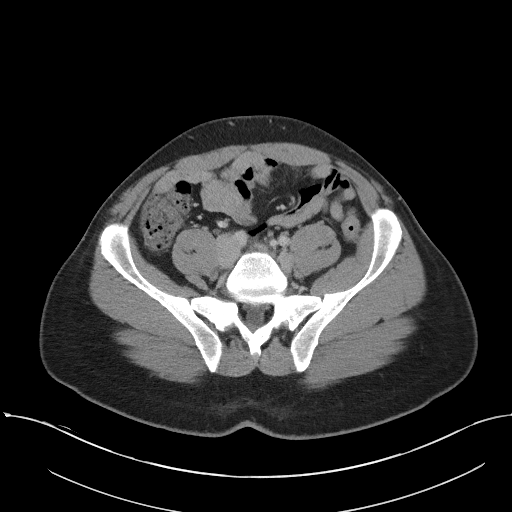
[im 54/62  soft-tissue]
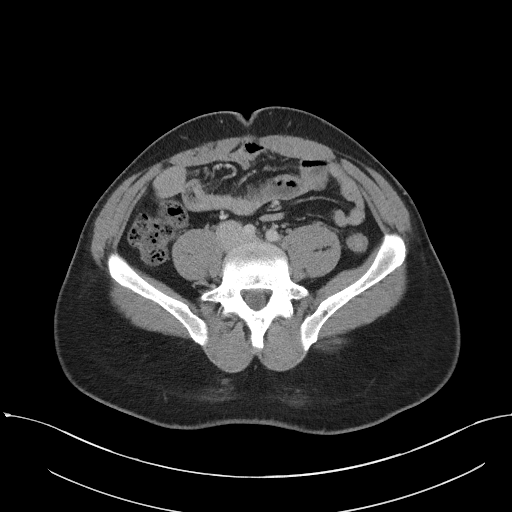
[im 58/62  soft-tissue]
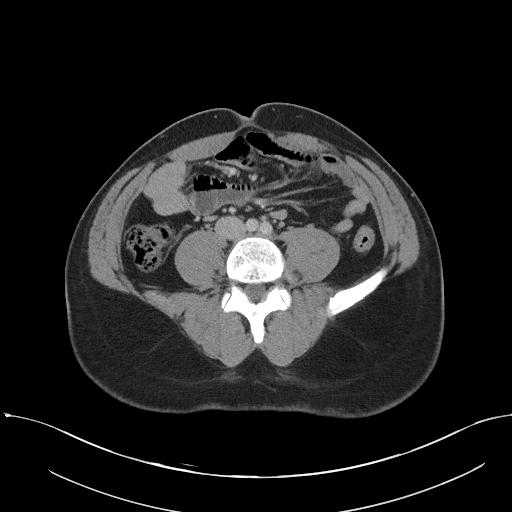

[Series 4: coronal st · coronal · 0.62mm/px · 3 of 101 slices shown]
[im 34/101  soft-tissue]
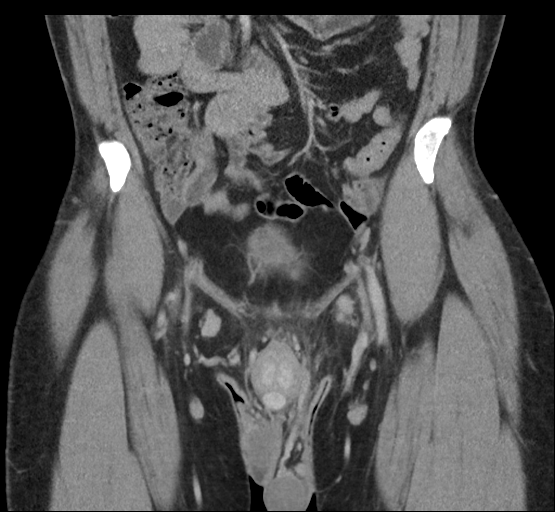
[im 45/101  soft-tissue]
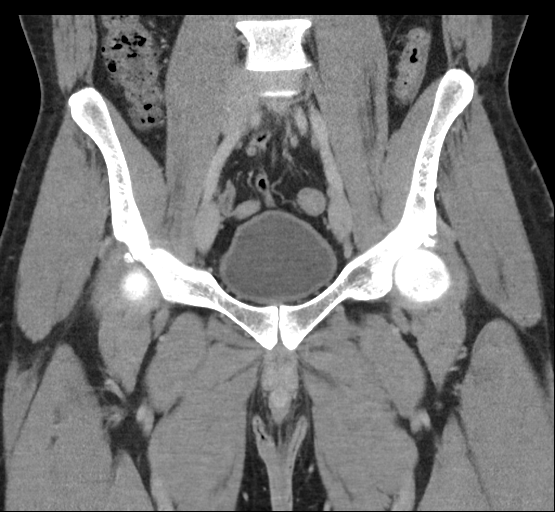
[im 56/101  soft-tissue]
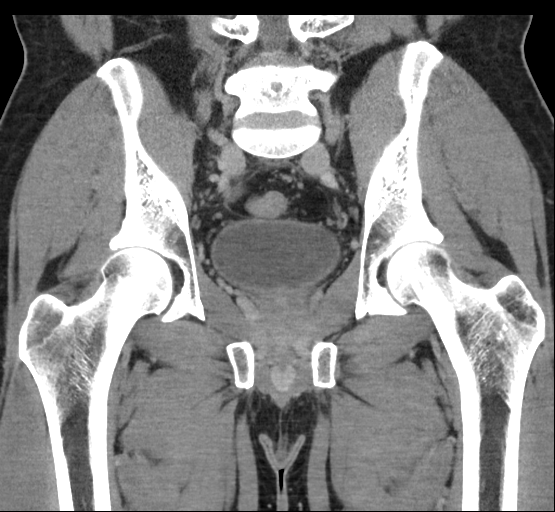

[17 of 46 positions shown; findings below may reference images not displayed]

FINDINGS: Urinary Tract:  Bladder unremarkable.

Bowel: No small bowel or colonic dilatation within the visualized
pelvis. Rectum unremarkable. Abnormal linear track of soft tissue
attenuation tracks to the left of the lower rectum and anus down to
the skin of the intergluteal fold. No focal fluid collection to
suggest drainable abscess.

Vascular/Lymphatic: Major arterial and venous anatomy along the
pelvic sidewalls is patent. No pelvic sidewall lymphadenopathy.

Reproductive: The prostate gland and seminal vesicles are
unremarkable.

Other:  No intraperitoneal free fluid.

Musculoskeletal: No worrisome lytic or sclerotic osseous
abnormality.
IMPRESSION: A linear tract of soft tissue attenuation with subtle
edema/inflammation tracks along the left lower rectum/anus down to
the skin of the intergluteal fold. Imaging features highly
suspicious for fistula. No focal fluid collection to suggest
drainable abscess at this time.
